# Patient Record
Sex: Male | Born: 1975
Health system: Southern US, Community
[De-identification: ages and names within clinical notes are randomized; demographics above are authoritative.]

## PROBLEM LIST (undated history)

## (undated) DIAGNOSIS — R413 Other amnesia: Secondary | ICD-10-CM

## (undated) DIAGNOSIS — S069X9A Unspecified intracranial injury with loss of consciousness of unspecified duration, initial encounter: Secondary | ICD-10-CM

## (undated) DIAGNOSIS — R569 Unspecified convulsions: Secondary | ICD-10-CM

## (undated) DIAGNOSIS — J45909 Unspecified asthma, uncomplicated: Secondary | ICD-10-CM

## (undated) HISTORY — PX: CRANIOTOMY: SHX93

## (undated) HISTORY — PX: BRAIN SURGERY: SHX531

## (undated) HISTORY — DX: Unspecified convulsions: R56.9

---

## 2004-07-14 DIAGNOSIS — S069X9A Unspecified intracranial injury with loss of consciousness of unspecified duration, initial encounter: Secondary | ICD-10-CM

## 2004-07-14 DIAGNOSIS — S069XAA Unspecified intracranial injury with loss of consciousness status unknown, initial encounter: Secondary | ICD-10-CM

## 2004-07-14 HISTORY — DX: Unspecified intracranial injury with loss of consciousness of unspecified duration, initial encounter: S06.9X9A

## 2004-07-14 HISTORY — PX: ENUCLEATION: SHX628

## 2004-07-14 HISTORY — PX: BRAIN SURGERY: SHX531

## 2004-07-14 HISTORY — DX: Unspecified intracranial injury with loss of consciousness status unknown, initial encounter: S06.9XAA

## 2005-11-01 HISTORY — PX: BRAIN SURGERY: SHX531

## 2008-12-14 ENCOUNTER — Emergency Department (HOSPITAL_COMMUNITY): Admission: EM | Admit: 2008-12-14 | Discharge: 2008-12-14 | Payer: Self-pay | Admitting: Emergency Medicine

## 2009-05-12 ENCOUNTER — Emergency Department (HOSPITAL_COMMUNITY): Admission: EM | Admit: 2009-05-12 | Discharge: 2009-05-13 | Payer: Self-pay | Admitting: Emergency Medicine

## 2010-01-05 ENCOUNTER — Encounter
Admission: RE | Admit: 2010-01-05 | Discharge: 2010-01-07 | Payer: Self-pay | Admitting: Physical Medicine & Rehabilitation

## 2010-01-07 ENCOUNTER — Ambulatory Visit: Payer: Self-pay | Admitting: Physical Medicine & Rehabilitation

## 2010-05-13 ENCOUNTER — Encounter
Admission: RE | Admit: 2010-05-13 | Discharge: 2010-05-22 | Payer: Self-pay | Admitting: Physical Medicine & Rehabilitation

## 2010-05-22 ENCOUNTER — Ambulatory Visit: Payer: Self-pay | Admitting: Physical Medicine & Rehabilitation

## 2010-08-26 ENCOUNTER — Encounter
Admission: RE | Admit: 2010-08-26 | Discharge: 2010-09-01 | Payer: Self-pay | Source: Home / Self Care | Attending: Physical Medicine & Rehabilitation | Admitting: Physical Medicine & Rehabilitation

## 2010-09-01 ENCOUNTER — Ambulatory Visit: Payer: Self-pay | Admitting: Physical Medicine & Rehabilitation

## 2010-11-19 ENCOUNTER — Encounter
Admission: RE | Admit: 2010-11-19 | Discharge: 2010-11-24 | Payer: Self-pay | Source: Home / Self Care | Attending: Physical Medicine & Rehabilitation | Admitting: Physical Medicine & Rehabilitation

## 2010-11-24 ENCOUNTER — Ambulatory Visit
Admission: RE | Admit: 2010-11-24 | Discharge: 2010-11-24 | Payer: Self-pay | Source: Home / Self Care | Attending: Physical Medicine & Rehabilitation | Admitting: Physical Medicine & Rehabilitation

## 2011-02-16 LAB — CBC
MCHC: 34.4 g/dL (ref 30.0–36.0)
MCV: 89.2 fL (ref 78.0–100.0)
RBC: 4.93 MIL/uL (ref 4.22–5.81)

## 2011-02-16 LAB — COMPREHENSIVE METABOLIC PANEL
AST: 42 U/L — ABNORMAL HIGH (ref 0–37)
CO2: 27 mEq/L (ref 19–32)
Calcium: 9.2 mg/dL (ref 8.4–10.5)
Creatinine, Ser: 1.13 mg/dL (ref 0.4–1.5)
GFR calc Af Amer: >60 mL/min (ref >60)
GFR calc non Af Amer: >60 mL/min (ref >60)

## 2011-02-16 LAB — URINALYSIS, ROUTINE W REFLEX MICROSCOPIC
Bilirubin Urine: NEGATIVE
Glucose, UA: NEGATIVE mg/dL
Ketones, ur: 15 mg/dL — AB
pH: 5.5 (ref 5.0–8.0)

## 2011-02-16 LAB — DIFFERENTIAL
Eosinophils Relative: 3 % (ref 0–5)
Lymphocytes Relative: 8 % — ABNORMAL LOW (ref 12–46)
Lymphs Abs: 0.7 10*3/uL (ref 0.7–4.0)
Neutro Abs: 7.3 10*3/uL (ref 1.7–7.7)
Neutrophils Relative %: 84 % — ABNORMAL HIGH (ref 43–77)

## 2011-02-16 LAB — RAPID URINE DRUG SCREEN, HOSP PERFORMED
Barbiturates: NOT DETECTED
Benzodiazepines: NOT DETECTED
Cocaine: NOT DETECTED

## 2011-02-16 LAB — URINE MICROSCOPIC-ADD ON

## 2011-03-16 ENCOUNTER — Encounter
Payer: Worker's Compensation | Attending: Physical Medicine & Rehabilitation | Admitting: Physical Medicine & Rehabilitation

## 2011-03-16 DIAGNOSIS — S069XAS Unspecified intracranial injury with loss of consciousness status unknown, sequela: Secondary | ICD-10-CM | POA: Insufficient documentation

## 2011-03-16 DIAGNOSIS — R569 Unspecified convulsions: Secondary | ICD-10-CM

## 2011-03-16 DIAGNOSIS — R4189 Other symptoms and signs involving cognitive functions and awareness: Secondary | ICD-10-CM | POA: Insufficient documentation

## 2011-03-16 DIAGNOSIS — Z9001 Acquired absence of eye: Secondary | ICD-10-CM | POA: Insufficient documentation

## 2011-03-16 DIAGNOSIS — S069X9S Unspecified intracranial injury with loss of consciousness of unspecified duration, sequela: Secondary | ICD-10-CM | POA: Insufficient documentation

## 2011-03-16 DIAGNOSIS — F17200 Nicotine dependence, unspecified, uncomplicated: Secondary | ICD-10-CM | POA: Insufficient documentation

## 2011-03-16 DIAGNOSIS — G40909 Epilepsy, unspecified, not intractable, without status epilepticus: Secondary | ICD-10-CM | POA: Insufficient documentation

## 2011-03-16 DIAGNOSIS — G4700 Insomnia, unspecified: Secondary | ICD-10-CM

## 2011-03-16 DIAGNOSIS — S069X9A Unspecified intracranial injury with loss of consciousness of unspecified duration, initial encounter: Secondary | ICD-10-CM

## 2011-03-16 DIAGNOSIS — X58XXXS Exposure to other specified factors, sequela: Secondary | ICD-10-CM | POA: Insufficient documentation

## 2011-03-16 DIAGNOSIS — G479 Sleep disorder, unspecified: Secondary | ICD-10-CM | POA: Insufficient documentation

## 2011-03-16 DIAGNOSIS — F070 Personality change due to known physiological condition: Secondary | ICD-10-CM

## 2011-03-16 NOTE — Assessment & Plan Note (Signed)
Mark Chambers is back regarding his traumatic brain injury.  He is doing better with his Keppra.  He has had no further seizures.  He still has problems of sleep and memory.  He is sleeping better for the most part, but still often plays video games late at night and has a TV on.  He tells me that he did have a driving assessment done while he was in [place].  He did not bring that report with him today and frankly does not know where it is.  Mother is with him today and notes that he has had diarrhea and frequent bowel movements.  Some of his siblings also suffered from this as well as his father.  REVIEW OF SYSTEMS:  Notable for the above.  Full 12-point review is in the written health and history section of the chart.  SOCIAL HISTORY:  The patient single, is still living with his brother. Smoking a pack of cigarettes per week.  PHYSICAL EXAMINATION:  VITAL SIGNS:  Blood pressure is 133/69, pulse 77, respiratory rate 18, he is satting 98% on room air. GENERAL:  The patient is pleasant, alert.  Affect is bright and appropriate.  Insight and awareness is good.  He still has some problems with memory and attention.  His focus is reasonable still. MUSCULOSKELETAL:  Motor sensory function are intact for movements and gait.  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  ASSESSMENT: 1. 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 2. Ongoing cognitive deficits related to the above. 3. Seizure disorder.  PLAN: 1. We will need to acquire his driving assessment evaluation in order     to assess the need for further followup there. 2. We will decrease his Exelon to 3 mg daily to see if this helps some     of his diarrhea and bowel symptoms.  Recommended trying a probiotic     daily also. 3. Mark Chambers needs to be responsive regarding his sleep schedule and needs to     establish better habits overall.  He is not creating environment to     sleep, he will not sleep ultimately. 4.  Refilled his Focalin XR 20 mg daily #30. 5. I will see him back here in about 2 months.  He is to call me with     any problems or questions.     Ranelle Oyster, M.D. Electronically Signed    ZTS/MedQ D:  03/16/2011 12:01:02  T:  03/16/2011 23:41:10  Job #:  [id number]

## 2011-05-12 ENCOUNTER — Encounter
Payer: Worker's Compensation | Attending: Physical Medicine & Rehabilitation | Admitting: Physical Medicine & Rehabilitation

## 2011-05-12 DIAGNOSIS — S0570XA Avulsion of unspecified eye, initial encounter: Secondary | ICD-10-CM | POA: Insufficient documentation

## 2011-05-12 DIAGNOSIS — S069X9A Unspecified intracranial injury with loss of consciousness of unspecified duration, initial encounter: Secondary | ICD-10-CM | POA: Insufficient documentation

## 2011-05-12 DIAGNOSIS — X58XXXA Exposure to other specified factors, initial encounter: Secondary | ICD-10-CM | POA: Insufficient documentation

## 2011-05-12 DIAGNOSIS — F09 Unspecified mental disorder due to known physiological condition: Secondary | ICD-10-CM | POA: Insufficient documentation

## 2011-05-12 DIAGNOSIS — G40909 Epilepsy, unspecified, not intractable, without status epilepticus: Secondary | ICD-10-CM | POA: Insufficient documentation

## 2011-05-12 DIAGNOSIS — R569 Unspecified convulsions: Secondary | ICD-10-CM

## 2011-05-12 DIAGNOSIS — F070 Personality change due to known physiological condition: Secondary | ICD-10-CM

## 2011-05-12 DIAGNOSIS — R41840 Attention and concentration deficit: Secondary | ICD-10-CM | POA: Insufficient documentation

## 2011-05-12 DIAGNOSIS — S069XAA Unspecified intracranial injury with loss of consciousness status unknown, initial encounter: Secondary | ICD-10-CM | POA: Insufficient documentation

## 2011-05-12 NOTE — Assessment & Plan Note (Signed)
Mark Chambers is back regarding his traumatic brain injury.  We bumped the Exelon down to 3 mg daily and his GI symptoms are better.  He has not required his driving testing records yet, but they told him they would send him to [place].  He is working on better scheduling and sleep patterns as a whole.  The patient is anxious to drive.  REVIEW OF SYSTEMS:  The patient reports loss of taste, smell, bowel control problems as noted previously, but improved.  Full 12-point review is in the written health and history section of the chart.  SOCIAL HISTORY:  Unchanged.  PHYSICAL EXAMINATION:  VITAL SIGNS:  Blood pressure 115/72, pulse 86, respiratory rate 18, he is satting 99% on room air. GENERAL:  The patient is pleasant and alert.  His attention seems better.  He does have some problems with short-term memory and focus though at times.  Balance is good.  He has normal sensory function.  He has some impairment of his visual fields due to his prosthetic left eye.  ASSESSMENT: 1. Traumatic brain injury, his left eye, enucleation. 2. 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 3. History of seizure disorder.  PLAN: 1. Await copies of recent driving testing.  Would need repeat testing     to pursue further driving. 2. Due to his positive response to the Exelon decrease, we will go     ahead and stop the medication.  He does complain of feeling jittery     and restless at times, particularly when he is focusing on activity     and I wonder whether this is a side effect of medication versus the     brain injury itself.  If the Exelon decrease helps this then we can     stay where we are at.  If he does not see a big change in this, we     could consider increasing his Focalin to 30 mg.  I asked him to     call me regardless about 2 weeks to tell me where he is at and then     we can move forward from there.  I did not refill his Focalin as     this was done recently.  Mark Chambers was present for this  exam and     discussion.     Ranelle Oyster, M.D. Electronically Signed    ZTS/MedQ D:  05/12/2011 14:59:33  T:  05/12/2011 21:22:00  Job #:  [id number]

## 2011-07-07 ENCOUNTER — Encounter
Payer: Worker's Compensation | Attending: Physical Medicine & Rehabilitation | Admitting: Physical Medicine & Rehabilitation

## 2011-07-07 DIAGNOSIS — F09 Unspecified mental disorder due to known physiological condition: Secondary | ICD-10-CM | POA: Insufficient documentation

## 2011-07-07 DIAGNOSIS — R41840 Attention and concentration deficit: Secondary | ICD-10-CM | POA: Insufficient documentation

## 2011-07-07 DIAGNOSIS — S069XAA Unspecified intracranial injury with loss of consciousness status unknown, initial encounter: Secondary | ICD-10-CM | POA: Insufficient documentation

## 2011-07-07 DIAGNOSIS — R569 Unspecified convulsions: Secondary | ICD-10-CM

## 2011-07-07 DIAGNOSIS — X58XXXA Exposure to other specified factors, initial encounter: Secondary | ICD-10-CM | POA: Insufficient documentation

## 2011-07-07 DIAGNOSIS — S069X9A Unspecified intracranial injury with loss of consciousness of unspecified duration, initial encounter: Secondary | ICD-10-CM | POA: Insufficient documentation

## 2011-07-07 DIAGNOSIS — S0570XA Avulsion of unspecified eye, initial encounter: Secondary | ICD-10-CM | POA: Insufficient documentation

## 2011-07-07 DIAGNOSIS — G40909 Epilepsy, unspecified, not intractable, without status epilepticus: Secondary | ICD-10-CM | POA: Insufficient documentation

## 2011-07-07 DIAGNOSIS — F070 Personality change due to known physiological condition: Secondary | ICD-10-CM

## 2011-07-07 NOTE — Assessment & Plan Note (Signed)
The patient is back regarding his traumatic brain injury.  We weaned the Exelon off and he is doing quite well.  He actually ran out of his Focalin on Friday and he was not able to come back for a pick up.  He states he had noticed lot of change.  Mother feels that he has had a drop off over last several months with his attention and focus and becomes very distracted.  He states his sleeping is better.  He does complain of being a bit depressed at times regarding his brain injury as well as his persistent deficits.  REVIEW OF SYSTEMS:  Notable for the above.  Full 12-point review is in the written health and history section of the chart.  SOCIAL HISTORY:  Unchanged, he is living with his younger brother.  He is smoking a pack cigarettes per week.  PHYSICAL EXAMINATION:  VITAL SIGNS:  Blood pressure is 105/67, pulse 66, respiratory rate 14 and he is satting 98% on room air. GENERAL:  The patient is pleasant and alert. NEUROLOGIC:  He continues to have some memory and focus problems, although I did not see it beyond baseline.  He is a bit flat, but pleasant.  He does not appear overtly depressed.  Gait is generally stable.  He continues to have the prosthetic left eye.  Vision is not changed. HEART:  Regular. CHEST:  Clear. ABDOMEN:  Soft and nontender.  ASSESSMENT: 1. 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 2. Cognitive attention deficits related to the above. 3. Seizure disorder.  PLAN: 1. We will hold off on the Focalin at this point and see how he does.     I suggested he though increasing the Focalin and observing for     further side effects or trying something like Strattera. 2. I did order multiple tests to assess for any other causes of     decreased attention, awareness and overall energy levels.  We will     check TSH, testosterone level, cortisol, CBC, CMET and B12 levels. 3. I will see the patient back and scheduled in about 3 months.  I     will call him  regarding the results and asked the patient to call     me if he has any further needs and wants to start one of the     medications mentioned above.     Ranelle Oyster, M.D. Electronically Signed    ZTS/MedQ D:  07/07/2011 11:59:12  T:  07/07/2011 12:50:38  Job #:  [id number]

## 2011-10-06 ENCOUNTER — Encounter: Payer: Worker's Compensation | Admitting: Physical Medicine & Rehabilitation

## 2011-10-06 ENCOUNTER — Encounter
Payer: Worker's Compensation | Attending: Physical Medicine & Rehabilitation | Admitting: Physical Medicine & Rehabilitation

## 2011-10-06 DIAGNOSIS — F070 Personality change due to known physiological condition: Secondary | ICD-10-CM

## 2011-10-06 DIAGNOSIS — G40909 Epilepsy, unspecified, not intractable, without status epilepticus: Secondary | ICD-10-CM | POA: Insufficient documentation

## 2011-10-06 DIAGNOSIS — S0570XA Avulsion of unspecified eye, initial encounter: Secondary | ICD-10-CM | POA: Insufficient documentation

## 2011-10-06 DIAGNOSIS — S069X9A Unspecified intracranial injury with loss of consciousness of unspecified duration, initial encounter: Secondary | ICD-10-CM | POA: Insufficient documentation

## 2011-10-06 DIAGNOSIS — X58XXXA Exposure to other specified factors, initial encounter: Secondary | ICD-10-CM | POA: Insufficient documentation

## 2011-10-06 DIAGNOSIS — F09 Unspecified mental disorder due to known physiological condition: Secondary | ICD-10-CM | POA: Insufficient documentation

## 2011-10-06 DIAGNOSIS — S069XAA Unspecified intracranial injury with loss of consciousness status unknown, initial encounter: Secondary | ICD-10-CM | POA: Insufficient documentation

## 2011-10-06 DIAGNOSIS — R41840 Attention and concentration deficit: Secondary | ICD-10-CM | POA: Insufficient documentation

## 2011-10-06 DIAGNOSIS — R569 Unspecified convulsions: Secondary | ICD-10-CM

## 2011-10-07 NOTE — Assessment & Plan Note (Signed)
HISTORY:  [name] is back regarding his brain injury.  We weaned off the Focalin at the last visit.  He really had no problems.  He had one day "where he felt out of it"  but it was related to some other stressors around in the house, it seems.  His mood and things are going well in his new location.  He checked lab work at last visit and was all within normal limits and our office was notified to that effect.  He does still have vision issues on the left side, but for the most part, these have been improved.  He will go back to see Ophthalmology at [facility] for further recommendations regarding his eyewear and prescription. Sleep is fair.  REVIEW OF SYSTEMS:  Notable for the above.  Full 12-point review is in the written health history section in the chart.  SOCIAL HISTORY:  Unchanged.  He is living with his younger brother.  He does not smoke anymore as he has quit and he is quite proud of that.  PHYSICAL EXAMINATION:  VITAL SIGNS:  Blood pressure is 108/70, pulse 79, respiratory rate 16, he is satting 94% on room air. GENERAL:  The patient is pleasant and alert. NEUROLOGIC:  Vision is unchanged in my opinion grossly.  Gait stable. His memory and attention are fair and he is at his baseline.  Affect is slightly flat, but he is generally appropriate, otherwise.  ASSESSMENT: 1. Traumatic brain injury, left eye enucleation. 2. 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 3. Seizure disorder.  PLAN: 1. I would now recommend any further stimulant at this point as I     think he is doing quite well off them and really we have seen no     change. 2. Continue with the Keppra and trazodone for sleep and seizure     prophylaxis.  He has optimized sleep and better health hygiene as a     whole, which I think he is trying to do. 3. I will follow up at [facility]. 4. I will see him back in 6 months.     Ranelle Oyster, M.D. Electronically Signed    ZTS/MedQ D:   10/06/2011 14:43:31  T:  10/06/2011 17:55:48  Job #:  [id number]

## 2012-02-16 ENCOUNTER — Other Ambulatory Visit: Payer: Self-pay | Admitting: *Deleted

## 2012-02-16 MED ORDER — LEVETIRACETAM ER 500 MG PO TB24: 1000.0000 mg | ORAL_TABLET | Freq: Every day | ORAL | Status: DC

## 2012-03-01 ENCOUNTER — Other Ambulatory Visit: Payer: Self-pay | Admitting: *Deleted

## 2012-03-01 MED ORDER — TRAZODONE HCL 150 MG PO TABS: 150.0000 mg | ORAL_TABLET | Freq: Every day | ORAL | Status: DC

## 2012-04-05 ENCOUNTER — Encounter: Payer: Self-pay | Admitting: Physical Medicine & Rehabilitation

## 2012-04-05 ENCOUNTER — Encounter
Payer: Worker's Compensation | Attending: Physical Medicine & Rehabilitation | Admitting: Physical Medicine & Rehabilitation

## 2012-04-05 VITALS — BP 130/78 | HR 74 | Resp 16 | Ht 76.0 in | Wt 191.0 lb

## 2012-04-05 DIAGNOSIS — G40909 Epilepsy, unspecified, not intractable, without status epilepticus: Secondary | ICD-10-CM

## 2012-04-05 DIAGNOSIS — X58XXXA Exposure to other specified factors, initial encounter: Secondary | ICD-10-CM | POA: Insufficient documentation

## 2012-04-05 DIAGNOSIS — S069XAA Unspecified intracranial injury with loss of consciousness status unknown, initial encounter: Secondary | ICD-10-CM | POA: Insufficient documentation

## 2012-04-05 DIAGNOSIS — S069X9A Unspecified intracranial injury with loss of consciousness of unspecified duration, initial encounter: Secondary | ICD-10-CM

## 2012-04-05 DIAGNOSIS — S0570XA Avulsion of unspecified eye, initial encounter: Secondary | ICD-10-CM | POA: Insufficient documentation

## 2012-04-05 NOTE — Patient Instructions (Signed)
YOU NEED TO SET SOME GOALS FOR THE LONG TERM!!!!!!!!!!

## 2012-04-05 NOTE — Progress Notes (Signed)
Subjective:    Patient ID: Mark Chambers, male    DOB: [birth year], 36 y.o.   MRN: [id number]  HPI Mark Chambers is back regarding his TBI. His mother is with him today. He apparently he is sleeping more. It sounds as if he's hanging around the house a lot. He's been keeping up with his medications for the most part. He is only taking the keppra-24 tabs once per day (1000mg ). Al denies further seizures.  He has come off stimulants. He feels that he has reasonable energy, but his motivation is lacking. He tries to exercise daily. Typically, he will walk a mile or two daily. He plays chess on a regular basis.   He has looked at enrolling at [facility], but hasn't followed through with that yet. He admittedly doesn't have a lot of goals for the long term.    Pain Inventory Average Pain 3 Pain Right Now 1 My pain is intermittent  In the last 24 hours, has pain interfered with the following? General activity 2 Relation with others 0 Enjoyment of life 4 What TIME of day is your pain at its worst? night Sleep (in general) Fair  Pain is worse with: inactivity Pain improves with: rest Relief from Meds: N/A  Mobility walk without assistance how many minutes can you walk? 90 ability to climb steps?  yes do you drive?  yes  Function not employed: date last employed 07/14/2004 disabled: date disabled 07/14/2004 I need assistance with the following:  meal prep and household duties  Neuro/Psych confusion  Prior Studies Any changes since last visit?  no  Physicians involved in your care Any changes since last visit?  no   History reviewed. No pertinent family history. History   Social History  . Marital Status: Single    Spouse Name: N/A    Number of Children: N/A  . Years of Education: N/A   Social History Main Topics  . Smoking status: Current Some Day Smoker  . Smokeless tobacco: None  . Alcohol Use: None  . Drug Use: None  . Sexually Active: None   Other Topics Concern  .  None   Social History Narrative  . None   Past Surgical History  Procedure Date  . Brain surgery   . Craniotomy    Past Medical History  Diagnosis Date  . Seizures    BP 130/78  Pulse 74  Resp 16  Ht 6\' 4"  (1.93 m)  Wt 191 lb (86.637 kg)  BMI 23.25 kg/m2  SpO2 98%      Review of Systems  Constitutional: Negative.   HENT: Negative.   Eyes: Negative.   Respiratory: Negative.   Cardiovascular: Negative.   Gastrointestinal: Negative.   Genitourinary: Negative.   Musculoskeletal: Negative.   Skin: Negative.   Neurological: Negative.   Hematological: Negative.   Psychiatric/Behavioral: Positive for confusion.       Objective:   Physical Exam  Constitutional: He is oriented to person, place, and time. He appears well-developed and well-nourished.  HENT:  Head: Normocephalic and atraumatic.  Right Ear: External ear normal.  Left Ear: External ear normal.  Mouth/Throat: Oropharynx is clear and moist.  Eyes: Conjunctivae and EOM are normal. Pupils are equal, round, and reactive to light.  Neck: Normal range of motion. Neck supple.  Cardiovascular: Normal rate and regular rhythm.   Pulmonary/Chest: Effort normal and breath sounds normal. No respiratory distress. He has no wheezes.  Abdominal: Soft.  Neurological: He is alert and oriented to person, place, and  time.       Left eye enucleated. Short term memory deficits. Conversationally appropriate. Fair insight and awareness.  Skin: Skin is warm.  Psychiatric: He has a normal mood and affect. His behavior is normal. Judgment and thought content normal.          Assessment & Plan:  ASSESSMENT:  1. Traumatic brain injury, left eye enucleation.  2. 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  3. Seizure disorder.   PLAN:  1. Discussed goal related activities. At this point, he needs to find some "bigger picture" aspirations which can help drive him forward. He needs to start setting goals to start  with. 2. Continue with the Keppra and trazodone for sleep and seizure  prophylaxis.  3. I will see him back in 6 months.

## 2012-05-15 ENCOUNTER — Other Ambulatory Visit: Payer: Self-pay | Admitting: Physical Medicine & Rehabilitation

## 2012-08-07 ENCOUNTER — Other Ambulatory Visit: Payer: Self-pay | Admitting: *Deleted

## 2012-08-07 MED ORDER — LEVETIRACETAM ER 500 MG PO TB24: 1000.0000 mg | ORAL_TABLET | Freq: Every day | ORAL | Status: DC

## 2012-09-11 ENCOUNTER — Other Ambulatory Visit: Payer: Self-pay | Admitting: Physical Medicine & Rehabilitation

## 2012-10-04 ENCOUNTER — Encounter
Payer: Worker's Compensation | Attending: Physical Medicine & Rehabilitation | Admitting: Physical Medicine & Rehabilitation

## 2012-10-04 ENCOUNTER — Encounter: Payer: Self-pay | Admitting: Physical Medicine & Rehabilitation

## 2012-10-04 VITALS — BP 128/78 | HR 78 | Resp 14 | Ht 76.0 in | Wt 197.8 lb

## 2012-10-04 DIAGNOSIS — S069X9A Unspecified intracranial injury with loss of consciousness of unspecified duration, initial encounter: Secondary | ICD-10-CM

## 2012-10-04 DIAGNOSIS — X58XXXA Exposure to other specified factors, initial encounter: Secondary | ICD-10-CM | POA: Insufficient documentation

## 2012-10-04 DIAGNOSIS — G40909 Epilepsy, unspecified, not intractable, without status epilepticus: Secondary | ICD-10-CM

## 2012-10-04 DIAGNOSIS — R569 Unspecified convulsions: Secondary | ICD-10-CM | POA: Insufficient documentation

## 2012-10-04 DIAGNOSIS — S069X0A Unspecified intracranial injury without loss of consciousness, initial encounter: Secondary | ICD-10-CM | POA: Insufficient documentation

## 2012-10-04 DIAGNOSIS — R41840 Attention and concentration deficit: Secondary | ICD-10-CM | POA: Insufficient documentation

## 2012-10-04 NOTE — Patient Instructions (Signed)
CONTINUE TAKING YOUR TRAZODONE AT NIGHT. YOU MIGHT NEED TO TAKE AN EXTRA DOSE IF YOU AWAKEN BEFORE 3AM  YOU MAY ALSO TRY OVER THE COUNTER MELATONIN AT NIGHT TO ASSIST YOUR SLEEP. TAKE ONE TO TWO TABLETS (3-6MG ) WITH YOUR TRAZODONE. START WITH ONE TABLET FIRST

## 2012-10-04 NOTE — Progress Notes (Signed)
Subjective:    Patient ID: Mark Chambers, male    DOB: 11-12-1975, 36 y.o.   MRN: [id number]  HPI  Mark Chambers is back regarding his TBI. For the most part he has been resting better. He sometimes wakes up and has a difficult time going back to sleep afterwards. He is using the trazodone 4-5 days per week.   Otherwise things have not changed at home for the most part. He hasn't been involved in any new activities or ventures outside the home since the summer.   He would like to begin driving. He has not found "a driving school."    Pain Inventory Average Pain 3 Pain Right Now 2 My pain is dull  In the last 24 hours, has pain interfered with the following? General activity 0 Relation with others 3 Enjoyment of life 3 What TIME of day is your pain at its worst? night Sleep (in general) Fair  Pain is worse with: inactivity Pain improves with: rest Relief from Meds: 8  Mobility walk without assistance how many minutes can you walk? 90 ability to climb steps?  yes do you drive?  no  Function disabled: date disabled 2005 I need assistance with the following:  meal prep and household duties  Neuro/Psych bowel control problems  Prior Studies Any changes since last visit?  no  Physicians involved in your care Any changes since last visit?  no   History reviewed. No pertinent family history. History   Social History  . Marital Status: Single    Spouse Name: N/A    Number of Children: N/A  . Years of Education: N/A   Social History Main Topics  . Smoking status: Former Games developer  . Smokeless tobacco: Never Used  . Alcohol Use: None  . Drug Use: None  . Sexually Active: None   Other Topics Concern  . None   Social History Narrative  . None   Past Surgical History  Procedure Date  . Brain surgery   . Craniotomy    Past Medical History  Diagnosis Date  . Seizures    BP 128/78  Pulse 78  Resp 14  Ht 6\' 4"  (1.93 m)  Wt 197 lb 12.8 oz (89.721 kg)  BMI  24.08 kg/m2  SpO2 98%   Review of Systems  All other systems reviewed and are negative.       Objective:   Physical Exam Constitutional: He is oriented to person, place, and time. He appears well-developed and well-nourished.  HENT:  Head: Normocephalic and atraumatic.  Right Ear: External ear normal.  Left Ear: External ear normal.  Mouth/Throat: Oropharynx is clear and moist.  Eyes: Conjunctivae and EOM are normal. Pupils are equal, round, and reactive to light.  Neck: Normal range of motion. Neck supple.  Cardiovascular: Normal rate and regular rhythm.  Pulmonary/Chest: Effort normal and breath sounds normal. No respiratory distress. He has no wheezes.  Abdominal: Soft.  Neurological: He is alert and oriented to person, place, and time.  Left eye enucleated. Short term memory deficits. Conversationally appropriate. Fair insight and awareness. Wearing new glasses today. Balance is good. Continues to have STM deficits and problems with attention Skin: Skin is warm.  Psychiatric: He has a normal mood and affect. His behavior is normal. Judgment and thought content normal.    Assessment & Plan:   ASSESSMENT:  1. Traumatic brain injury, left eye enucleation.  2. 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  3. Seizure disorder.   PLAN:  1. Discussed goal related activities which are outside of "his groove.". A  2. Continue with the Keppra and trazodone for sleep and seizure  prophylaxis. I think he needs to continue the trazodone on a scheduled basis for now. We also discussed the use of melatonin to improve his sleep hygiene.  3. Made a referral to OT for formal driving evaluation and potentially treatment 4. I will see him back in 6 months.

## 2012-12-04 DIAGNOSIS — H31019 Macula scars of posterior pole (postinflammatory) (post-traumatic), unspecified eye: Secondary | ICD-10-CM | POA: Insufficient documentation

## 2012-12-04 DIAGNOSIS — H5210 Myopia, unspecified eye: Secondary | ICD-10-CM | POA: Insufficient documentation

## 2012-12-04 DIAGNOSIS — Q111 Other anophthalmos: Secondary | ICD-10-CM | POA: Insufficient documentation

## 2012-12-04 DIAGNOSIS — H53419 Scotoma involving central area, unspecified eye: Secondary | ICD-10-CM | POA: Insufficient documentation

## 2012-12-27 ENCOUNTER — Other Ambulatory Visit: Payer: Self-pay | Admitting: Physical Medicine & Rehabilitation

## 2013-01-08 ENCOUNTER — Telehealth: Payer: Self-pay | Admitting: Physical Medicine & Rehabilitation

## 2013-01-08 NOTE — Telephone Encounter (Signed)
Radene Ou from [employer] insurance called and they need a letter from Dr. Riley Kill stating that this patient has been seizure free for 6 months.  Please fax the letter to Radene Ou [phone] and put reference claim # on cover sheet.  Michael's phone [phone]

## 2013-01-09 NOTE — Telephone Encounter (Signed)
Why is this needed? i am not going to write such a letter without making sure it's for his best interest

## 2013-01-09 NOTE — Telephone Encounter (Signed)
Spoke with Casimiro Needle and he needs to know when the patient had his last seizure in order to get driving evaluation.  According to Casimiro Needle patient is not seeing any other providers.  Please advise.

## 2013-01-09 NOTE — Telephone Encounter (Signed)
It's been way over 6 months but i'm not sure of the exact date.

## 2013-01-10 NOTE — Telephone Encounter (Signed)
Left message advising case manager that the patient has not had a seizure in over 6 months but last date is unknown.

## 2013-02-06 ENCOUNTER — Other Ambulatory Visit: Payer: Self-pay | Admitting: Physical Medicine & Rehabilitation

## 2013-02-11 ENCOUNTER — Other Ambulatory Visit: Payer: Self-pay | Admitting: Physical Medicine & Rehabilitation

## 2013-04-03 ENCOUNTER — Encounter: Payer: Self-pay | Admitting: Physical Medicine & Rehabilitation

## 2013-04-03 ENCOUNTER — Encounter
Payer: Worker's Compensation | Attending: Physical Medicine & Rehabilitation | Admitting: Physical Medicine & Rehabilitation

## 2013-04-03 VITALS — BP 115/68 | HR 67 | Resp 14 | Ht 76.0 in | Wt 197.0 lb

## 2013-04-03 DIAGNOSIS — S069X9A Unspecified intracranial injury with loss of consciousness of unspecified duration, initial encounter: Secondary | ICD-10-CM | POA: Insufficient documentation

## 2013-04-03 DIAGNOSIS — G40909 Epilepsy, unspecified, not intractable, without status epilepticus: Secondary | ICD-10-CM | POA: Insufficient documentation

## 2013-04-03 DIAGNOSIS — S0570XA Avulsion of unspecified eye, initial encounter: Secondary | ICD-10-CM | POA: Insufficient documentation

## 2013-04-03 DIAGNOSIS — Z79899 Other long term (current) drug therapy: Secondary | ICD-10-CM | POA: Insufficient documentation

## 2013-04-03 DIAGNOSIS — S069XAA Unspecified intracranial injury with loss of consciousness status unknown, initial encounter: Secondary | ICD-10-CM | POA: Insufficient documentation

## 2013-04-03 DIAGNOSIS — Z5189 Encounter for other specified aftercare: Secondary | ICD-10-CM

## 2013-04-03 DIAGNOSIS — R41840 Attention and concentration deficit: Secondary | ICD-10-CM | POA: Insufficient documentation

## 2013-04-03 DIAGNOSIS — S069X0D Unspecified intracranial injury without loss of consciousness, subsequent encounter: Secondary | ICD-10-CM

## 2013-04-03 DIAGNOSIS — G4700 Insomnia, unspecified: Secondary | ICD-10-CM | POA: Insufficient documentation

## 2013-04-03 DIAGNOSIS — X58XXXA Exposure to other specified factors, initial encounter: Secondary | ICD-10-CM | POA: Insufficient documentation

## 2013-04-03 MED ORDER — TRAZODONE HCL 150 MG PO TABS: 75.0000 mg | ORAL_TABLET | Freq: Every day | ORAL | Status: DC

## 2013-04-03 NOTE — Progress Notes (Signed)
Subjective:    Patient ID: Mark Chambers, male    DOB: 03-27-1976, 37 y.o.   MRN: [id number]  HPI  Al is back regarding his TBI. Things have been fairly stable. He did bite his cheek pretty hard the other day, and his cheek has been pretty sore since then.   He is taking his medications as rx'ed. His sleep has been better. He is also taking 5 mg of melatonin which has helped.   He completed his driving assessment. He completed the recommended 15 hour observation afterwards. He was approved for day time, local driving. He is driving a scooter for local driving.   He has been active and out of the house pretty frequently. He is lifting weights too with his dumb bells at home.     Pain Inventory Average Pain 7 Pain Right Now 7 My pain is burning  In the last 24 hours, has pain interfered with the following? General activity 8 Relation with others 6 Enjoyment of life 6 What TIME of day is your pain at its worst? daytime Sleep (in general) Poor  Pain is worse with: unsure Pain improves with: pacing activities Relief from Meds: n/a  Mobility walk without assistance how many minutes can you walk? 60 ability to climb steps?  yes do you drive?  no Do you have any goals in this area?  yes  Function not employed: date last employed 07/14/2004 Do you have any goals in this area?  yes  Neuro/Psych confusion depression suicidal thoughts-no plan  Prior Studies Any changes since last visit?  no  Physicians involved in your care Any changes since last visit?  no   History reviewed. No pertinent family history. History   Social History  . Marital Status: Single    Spouse Name: N/A    Number of Children: N/A  . Years of Education: N/A   Social History Main Topics  . Smoking status: Former Games developer  . Smokeless tobacco: Never Used  . Alcohol Use: None  . Drug Use: None  . Sexually Active: None   Other Topics Concern  . None   Social History Narrative  . None    Past Surgical History  Procedure Laterality Date  . Brain surgery    . Craniotomy     Past Medical History  Diagnosis Date  . Seizures    BP 115/68  Pulse 67  Resp 14  Ht 6\' 4"  (1.93 m)  Wt 197 lb (89.359 kg)  BMI 23.99 kg/m2  SpO2 100%     Review of Systems  Musculoskeletal: Positive for myalgias and arthralgias.  Psychiatric/Behavioral: Positive for dysphoric mood.  All other systems reviewed and are negative.       Objective:   Physical Exam Constitutional: He is oriented to person, place, and time. He appears well-developed and well-nourished.  HENT:  Head: Normocephalic and atraumatic.  Right Ear: External ear normal.  Left Ear: External ear normal.  Mouth/Throat: Oropharynx is clear and moist.  Eyes: Conjunctivae and EOM are normal. Pupils are equal, round, and reactive to light.  Neck: Normal range of motion. Neck supple.  Cardiovascular: Normal rate and regular rhythm.  Pulmonary/Chest: Effort normal and breath sounds normal. No respiratory distress. He has no wheezes.  Abdominal: Soft.  Neurological: He is alert and oriented to person, place, and time.  Left eye enucleated.   Conversationally appropriate. Fair insight and awareness. Occasionally with attention issues but cognition is functional. Balance is good. Strength is 5/5. Skin: Skin is  warm.  Psychiatric: He has a normal mood and affect. His behavior is normal. Judgment and thought content normal.    Assessment & Plan:   ASSESSMENT:  1. Traumatic brain injury, left eye enucleation.  2. 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  3. Seizure disorder.  4. Insomnia.  PLAN:  1. We discussed safety with driving, particularly in a scooter. I really would prefer him driving a car.  2. Continue with the Keppra for seizure  prophylaxis. Continue with the melatonin for sleep. I think he could try to wean the trazodone and see how things go. 3. All questions were encouraged and answered.   4. I will see him back in 6 months.

## 2013-04-03 NOTE — Patient Instructions (Signed)
CALL ME WITH ANY PROBLEMS OR QUESTIONS (#[phone]).  HAVE A GOOD DAY  

## 2013-05-02 ENCOUNTER — Other Ambulatory Visit: Payer: Self-pay | Admitting: Physical Medicine & Rehabilitation

## 2013-05-11 ENCOUNTER — Other Ambulatory Visit: Payer: Self-pay | Admitting: Physical Medicine & Rehabilitation

## 2013-05-12 ENCOUNTER — Other Ambulatory Visit: Payer: Self-pay | Admitting: Physical Medicine & Rehabilitation

## 2013-05-15 ENCOUNTER — Other Ambulatory Visit: Payer: Self-pay | Admitting: *Deleted

## 2013-05-15 MED ORDER — TRAZODONE HCL 150 MG PO TABS: ORAL_TABLET | ORAL | Status: DC

## 2013-05-15 NOTE — Telephone Encounter (Signed)
Pharmacy sent over 2 elcetronic requests for trazodone 100mg  and 150mg .  His last visit in June with Dr Carmon Ginsberg his directions was trazedone 150mg  1/2-1 tablet q hs with instructions to try and wean himself down.  Refilled trazodone per those instructions.

## 2013-08-06 ENCOUNTER — Other Ambulatory Visit: Payer: Self-pay | Admitting: Physical Medicine & Rehabilitation

## 2013-10-03 ENCOUNTER — Encounter
Payer: Worker's Compensation | Attending: Physical Medicine & Rehabilitation | Admitting: Physical Medicine & Rehabilitation

## 2013-10-03 ENCOUNTER — Encounter: Payer: Self-pay | Admitting: Physical Medicine & Rehabilitation

## 2013-10-03 VITALS — BP 101/67 | HR 72 | Resp 14 | Ht 76.0 in | Wt 203.0 lb

## 2013-10-03 DIAGNOSIS — G40909 Epilepsy, unspecified, not intractable, without status epilepticus: Secondary | ICD-10-CM

## 2013-10-03 DIAGNOSIS — S069X9S Unspecified intracranial injury with loss of consciousness of unspecified duration, sequela: Secondary | ICD-10-CM | POA: Insufficient documentation

## 2013-10-03 DIAGNOSIS — S069X0S Unspecified intracranial injury without loss of consciousness, sequela: Secondary | ICD-10-CM

## 2013-10-03 DIAGNOSIS — S069XAS Unspecified intracranial injury with loss of consciousness status unknown, sequela: Secondary | ICD-10-CM

## 2013-10-03 DIAGNOSIS — Z9001 Acquired absence of eye: Secondary | ICD-10-CM | POA: Insufficient documentation

## 2013-10-03 DIAGNOSIS — G4700 Insomnia, unspecified: Secondary | ICD-10-CM

## 2013-10-03 DIAGNOSIS — R4189 Other symptoms and signs involving cognitive functions and awareness: Secondary | ICD-10-CM | POA: Insufficient documentation

## 2013-10-03 DIAGNOSIS — X58XXXS Exposure to other specified factors, sequela: Secondary | ICD-10-CM | POA: Insufficient documentation

## 2013-10-03 MED ORDER — LEVETIRACETAM ER 500 MG PO TB24: ORAL_TABLET | ORAL | Status: DC

## 2013-10-03 NOTE — Patient Instructions (Signed)
FOR 2015----I WANT TO SEE YOU WORK TOWARD EXERCISE AND VOCATIONAL GOALS.   I WILL MAKE A VOCATIONAL REHAB REFERRAL.

## 2013-10-03 NOTE — Progress Notes (Signed)
Subjective:    Patient ID: Mark Chambers, male    DOB: 08-Apr-1976, 37 y.o.   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 He just came back from [place] spending the weekend with his family. He has been sleeping better as a whole. There have been no seizures.  He is participating in some leisure activities. He sometimes lifts weights. He still is riding his scooter. He says he needs a registration now for 2015.   He is now going to the [facility] support group.   He has a appt for a prosthetic eye appointment at the [place] next month.   Pain Inventory Average Pain 3 Pain Right Now 3 My pain is n/a  In the last 24 hours, has pain interfered with the following? General activity 0 Relation with others 5 Enjoyment of life 0 What TIME of day is your pain at its worst? daytime Sleep (in general) Fair  Pain is worse with: unsure Pain improves with: rest Relief from Meds: 5  Mobility walk without assistance how many minutes can you walk? 30 ability to climb steps?  yes do you drive?  yes  Function disabled: date disabled 07/2004 I need assistance with the following:  meal prep and household duties  Neuro/Psych bowel control problems  Prior Studies Any changes since last visit?  no  Physicians involved in your care Dr Ortencia Kick, Dr Clydene Pugh   History reviewed. No pertinent family history. History   Social History  . Marital Status: Single    Spouse Name: N/A    Number of Children: N/A  . Years of Education: N/A   Social History Main Topics  . Smoking status: Former Games developer  . Smokeless tobacco: Never Used  . Alcohol Use: None  . Drug Use: None  . Sexual Activity: None   Other Topics Concern  . None   Social History Narrative  . None   Past Surgical History  Procedure Laterality Date  . Brain surgery    . Craniotomy     Past Medical History  Diagnosis Date  . Seizures    BP 101/67  Pulse 72  Resp 14  Ht 6\' 4"  (1.93 m)  Wt 203 lb (92.08  kg)  BMI 24.72 kg/m2  SpO2 95%     Review of Systems  Gastrointestinal: Positive for diarrhea and constipation.  All other systems reviewed and are negative.       Objective:   Physical Exam  Constitutional: He is oriented to person, place, and time. He appears well-developed and well-nourished.  HENT:  Head: chronic facial/scalp wounds/scarring noted. Right Ear: External ear normal.  Left Ear: External ear normal.  Mouth/Throat: Oropharynx is clear and moist.  Eyes: Conjunctivae and EOM are normal. Pupils are equal, round, and reactive to light.  Neck: Normal range of motion. Neck supple.  Cardiovascular: Normal rate and regular rhythm.  Pulmonary/Chest: Effort normal and breath sounds normal. No respiratory distress. He has no wheezes.  Abdominal: Soft.  Neurological: He is alert and oriented to person, place, and time.  Left eye enucleated. Good insight and awareness. Still with short term memory and attentional deficits.  Balance is good. Strength is 5/5. Gait is normal.  Skin: Skin is warm.  Psychiatric: He has a normal mood and affect. His behavior is normal. Judgment and thought content normal.    Assessment & Plan:   ASSESSMENT:  1. Traumatic brain injury, left eye enucleation.  2. 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  3. Seizure disorder.  4. Insomnia.    PLAN:  1. Made a referral to [place] [facility] for consideration of volunteer work, ?part-time vocational re-entry  2. Continue with the Keppra for seizure  prophylaxis.  3. Continue with the melatonin along with trazodone for sleep. Will hold on a trazodone wean for now. 4. All questions were encouraged and answered.  I will see him back in 6 months.

## 2013-10-30 ENCOUNTER — Other Ambulatory Visit: Payer: Self-pay | Admitting: Physical Medicine & Rehabilitation

## 2013-11-27 ENCOUNTER — Other Ambulatory Visit: Payer: Self-pay | Admitting: Physical Medicine & Rehabilitation

## 2014-01-10 ENCOUNTER — Other Ambulatory Visit: Payer: Self-pay | Admitting: Physical Medicine & Rehabilitation

## 2014-02-17 ENCOUNTER — Emergency Department (HOSPITAL_COMMUNITY)
Admission: EM | Admit: 2014-02-17 | Discharge: 2014-02-18 | Disposition: A | Discharge status: Home / Self Care | Payer: Medicare Other | Attending: Emergency Medicine | Admitting: Emergency Medicine

## 2014-02-17 ENCOUNTER — Encounter (HOSPITAL_COMMUNITY): Payer: Self-pay | Admitting: Emergency Medicine

## 2014-02-17 ENCOUNTER — Emergency Department (HOSPITAL_COMMUNITY): Payer: Medicare Other

## 2014-02-17 DIAGNOSIS — G40909 Epilepsy, unspecified, not intractable, without status epilepticus: Secondary | ICD-10-CM | POA: Insufficient documentation

## 2014-02-17 DIAGNOSIS — Z87891 Personal history of nicotine dependence: Secondary | ICD-10-CM | POA: Insufficient documentation

## 2014-02-17 DIAGNOSIS — J45909 Unspecified asthma, uncomplicated: Secondary | ICD-10-CM | POA: Diagnosis not present

## 2014-02-17 DIAGNOSIS — Z79899 Other long term (current) drug therapy: Secondary | ICD-10-CM | POA: Diagnosis not present

## 2014-02-17 DIAGNOSIS — J45901 Unspecified asthma with (acute) exacerbation: Secondary | ICD-10-CM | POA: Diagnosis not present

## 2014-02-17 DIAGNOSIS — Z87820 Personal history of traumatic brain injury: Secondary | ICD-10-CM | POA: Diagnosis not present

## 2014-02-17 HISTORY — DX: Unspecified asthma, uncomplicated: J45.909

## 2014-02-17 HISTORY — DX: Unspecified intracranial injury with loss of consciousness of unspecified duration, initial encounter: S06.9X9A

## 2014-02-17 MED ORDER — ALBUTEROL SULFATE (2.5 MG/3ML) 0.083% IN NEBU
5.0000 mg | INHALATION_SOLUTION | Freq: Once | RESPIRATORY_TRACT | Status: AC
  Administered 2014-02-17: 5 mg via RESPIRATORY_TRACT
  Filled 2014-02-17: qty 6

## 2014-02-17 NOTE — ED Notes (Signed)
Pt states that he has been out of asthma medication for 2 years and has not had an asthma attack for that long as well. Pt states that he has been having difficulty breathing for a week (ever since the rain.)

## 2014-02-18 MED ORDER — ALBUTEROL SULFATE HFA 108 (90 BASE) MCG/ACT IN AERS: 2.0000 | INHALATION_SPRAY | RESPIRATORY_TRACT | Status: AC | PRN

## 2014-02-18 MED ORDER — PREDNISONE 20 MG PO TABS
60.0000 mg | ORAL_TABLET | Freq: Once | ORAL | Status: AC
  Administered 2014-02-18: 60 mg via ORAL
  Filled 2014-02-18: qty 3

## 2014-02-18 MED ORDER — PREDNISONE 20 MG PO TABS: 60.0000 mg | ORAL_TABLET | Freq: Every day | ORAL | Status: DC

## 2014-02-18 MED ORDER — ALBUTEROL SULFATE HFA 108 (90 BASE) MCG/ACT IN AERS
2.0000 | INHALATION_SPRAY | Freq: Once | RESPIRATORY_TRACT | Status: AC
  Administered 2014-02-18: 2 via RESPIRATORY_TRACT
  Filled 2014-02-18: qty 6.7

## 2014-02-18 NOTE — Discharge Instructions (Signed)
Asthma, Adult Asthma is a recurring condition in which the airways tighten and narrow. Asthma can make it difficult to breathe. It can cause coughing, wheezing, and shortness of breath. Asthma episodes (also called asthma attacks) range from minor to life-threatening. Asthma cannot be cured, but medicines and lifestyle changes can help control it. CAUSES Asthma is believed to be caused by inherited (genetic) and environmental factors, but its exact cause is unknown. Asthma may be triggered by allergens, lung infections, or irritants in the air. Asthma triggers are different for each person. Common triggers include:   Animal dander.  Dust mites.  Cockroaches.  Pollen from trees or grass.  Mold.  Smoke.  Air pollutants such as dust, household cleaners, hair sprays, aerosol sprays, paint fumes, strong chemicals, or strong odors.  Cold air, weather changes, and winds (which increase molds and pollens in the air).  Strong emotional expressions such as crying or laughing hard.  Stress.  Certain medicines (such as aspirin) or types of drugs (such as beta-blockers).  Sulfites in foods and drinks. Foods and drinks that may contain sulfites include dried fruit, potato chips, and sparkling grape juice.  Infections or inflammatory conditions such as the flu, a cold, or an inflammation of the nasal membranes (rhinitis).  Gastroesophageal reflux disease (GERD).  Exercise or strenuous activity. SYMPTOMS Symptoms may occur immediately after asthma is triggered or many hours later. Symptoms include:  Wheezing.  Excessive nighttime or early morning coughing.  Frequent or severe coughing with a common cold.  Chest tightness.  Shortness of breath. DIAGNOSIS  The diagnosis of asthma is made by a review of your medical history and a physical exam. Tests may also be performed. These may include:  Lung function studies. These tests show how much air you breath in and out.  Allergy  tests.  Imaging tests such as X-rays. TREATMENT  Asthma cannot be cured, but it can usually be controlled. Treatment involves identifying and avoiding your asthma triggers. It also involves medicines. There are 2 classes of medicine used for asthma treatment:   Controller medicines. These prevent asthma symptoms from occurring. They are usually taken every day.  Reliever or rescue medicines. These quickly relieve asthma symptoms. They are used as needed and provide short-term relief. Your health care provider will help you create an asthma action plan. An asthma action plan is a written plan for managing and treating your asthma attacks. It includes a list of your asthma triggers and how they may be avoided. It also includes information on when medicines should be taken and when their dosage should be changed. An action plan may also involve the use of a device called a peak flow meter. A peak flow meter measures how well the lungs are working. It helps you monitor your condition. HOME CARE INSTRUCTIONS   Take medicine as directed by your health care provider. Speak with your health care provider if you have questions about how or when to take the medicines.  Use a peak flow meter as directed by your health care provider. Record and keep track of readings.  Understand and use the action plan to help minimize or stop an asthma attack without needing to seek medical care.  Control your home environment in the following ways to help prevent asthma attacks:  Do not smoke. Avoid being exposed to secondhand smoke.  Change your heating and air conditioning filter regularly.  Limit your use of fireplaces and wood stoves.  Get rid of pests (such as roaches and   mice) and their droppings.  Throw away plants if you see mold on them.  Clean your floors and dust regularly. Use unscented cleaning products.  Try to have someone else vacuum for you regularly. Stay out of rooms while they are being  vacuumed and for a short while afterward. If you vacuum, use a dust mask from a hardware store, a double-layered or microfilter vacuum cleaner bag, or a vacuum cleaner with a HEPA filter.  Replace carpet with wood, tile, or vinyl flooring. Carpet can trap dander and dust.  Use allergy-proof pillows, mattress covers, and box spring covers.  Wash bed sheets and blankets every week in hot water and dry them in a dryer.  Use blankets that are made of polyester or cotton.  Clean bathrooms and kitchens with bleach. If possible, have someone repaint the walls in these rooms with mold-resistant paint. Keep out of the rooms that are being cleaned and painted.  Wash hands frequently. SEEK MEDICAL CARE IF:   You have wheezing, shortness of breath, or a cough even if taking medicine to prevent attacks.  The colored mucus you cough up (sputum) is thicker than usual.  Your sputum changes from clear or white to yellow, green, gray, or bloody.  You have any problems that may be related to the medicines you are taking (such as a rash, itching, swelling, or trouble breathing).  You are using a reliever medicine more than 2 3 times per week.  Your peak flow is still at 50 79% of you personal best after following your action plan for 1 hour. SEEK IMMEDIATE MEDICAL CARE IF:   You seem to be getting worse and are unresponsive to treatment during an asthma attack.  You are short of breath even at rest.  You get short of breath when doing very little physical activity.  You have difficulty eating, drinking, or talking due to asthma symptoms.  You develop chest pain.  You develop a fast heartbeat.  You have a bluish color to your lips or fingernails.  You are lightheaded, dizzy, or faint.  Your peak flow is less than 50% of your personal best.  You have a fever or persistent symptoms for more than 2 3 days.  You have a fever and symptoms suddenly get worse. MAKE SURE YOU:   Understand these  instructions.  Will watch your condition.  Will get help right away if you are not doing well or get worse. Document Released: 10/18/2005 Document Revised: 06/20/2013 Document Reviewed: 05/17/2013 Exit[employer] Patient Information 2014 [employer].  

## 2014-02-18 NOTE — ED Provider Notes (Signed)
Medical screening examination/treatment/procedure(s) were performed by non-physician practitioner and as supervising physician I was immediately available for consultation/collaboration.   EKG Interpretation None        Annalina Needles, MD 02/18/14 0533 

## 2014-02-18 NOTE — ED Provider Notes (Signed)
CSN: [id number]     Arrival date & time 02/17/14  2213 History   First MD Initiated Contact with Patient 02/17/14 2352     Chief Complaint  Patient presents with  . Shortness of Breath  . Asthma     (Consider location/radiation/quality/duration/timing/severity/associated sxs/prior Treatment) HPI Comments: Patient is 38 year old male with PMH significant for Brain surgery and asthma who presents to the ED with a week history of chest tightness, shortness of breath and wheezing.  He states that he has not had to use an inhaler for the past 2 years, no longer has one.  Has been trying OTC medication.  Denies fever, chills, headache, nausea, vomiting, has a cough but is non-productive.  Denies chest pain.  Patient is a 38 y.o. male presenting with shortness of breath and asthma. The history is provided by the patient. No language interpreter was used.  Shortness of Breath Severity:  Moderate Onset quality:  Gradual Duration:  1 week Timing:  Intermittent Progression:  Worsening Chronicity:  Chronic Context: weather changes   Relieved by:  Nothing Worsened by:  Nothing tried Ineffective treatments:  None tried Associated symptoms: cough and wheezing   Associated symptoms: no abdominal pain, no chest pain, no fever, no hemoptysis, no neck pain, no rash, no sore throat, no sputum production and no swollen glands   Asthma Associated symptoms include coughing. Pertinent negatives include no abdominal pain, chest pain, fever, neck pain, rash, sore throat or swollen glands.    Past Medical History  Diagnosis Date  . Seizures   . Asthma   . TBI (traumatic brain injury)    Past Surgical History  Procedure Laterality Date  . Brain surgery    . Craniotomy     History reviewed. No pertinent family history. History  Substance Use Topics  . Smoking status: Former Games developermoker  . Smokeless tobacco: Never Used  . Alcohol Use: Yes    Review of Systems  Constitutional: Negative for fever.   HENT: Negative for sore throat.   Respiratory: Positive for cough, shortness of breath and wheezing. Negative for hemoptysis and sputum production.   Cardiovascular: Negative for chest pain.  Gastrointestinal: Negative for abdominal pain.  Musculoskeletal: Negative for neck pain.  Skin: Negative for rash.  All other systems reviewed and are negative.     Allergies  Morphine and related  Home Medications   Prior to Admission medications   Medication Sig Start Date End Date Taking? Authorizing Provider  levETIRAcetam (KEPPRA XR) 500 MG 24 hr tablet TAKE 2 TABLETS BY MOUTH DAILY 10/03/13   Ranelle OysterZachary T Swartz, MD  MELATONIN ER PO Take by mouth.    Historical Provider, MD  traZODone (DESYREL) 150 MG tablet TAKE 1/2 TO 1 TABLET BY MOUTH DAILY AT BEDTIME .... TRY TO WEAN OFF 01/10/14   Ranelle OysterZachary T Swartz, MD   BP 121/76  Pulse 88  Temp(Src) 97.7 F (36.5 C) (Oral)  Resp 20  Ht 6\' 4"  (1.93 m)  Wt 202 lb 1 oz (91.655 kg)  BMI 24.61 kg/m2  SpO2 95% Physical Exam  Nursing note and vitals reviewed. Constitutional: He is oriented to person, place, and time. He appears well-developed and well-nourished. No distress.  HENT:  Head: Atraumatic.  Right Ear: External ear normal.  Left Ear: External ear normal.  Nose: Nose normal.  Mouth/Throat: Oropharynx is clear and moist. No oropharyngeal exudate.  Large well healed craniotomy scar and frontal bone deformity  Eyes: Conjunctivae are normal. Pupils are equal, round, and  reactive to light. No scleral icterus.  Neck: Normal range of motion. Neck supple.  Cardiovascular: Normal rate, regular rhythm and normal heart sounds.  Exam reveals no gallop and no friction rub.   No murmur heard. Pulmonary/Chest: Effort normal and breath sounds normal. No respiratory distress. He has no wheezes. He has no rales. He exhibits no tenderness.  Abdominal: Soft. Bowel sounds are normal. He exhibits no distension. There is no tenderness.  Musculoskeletal: Normal  range of motion. He exhibits no edema and no tenderness.  Lymphadenopathy:    He has no cervical adenopathy.  Neurological: He is alert and oriented to person, place, and time. He exhibits normal muscle tone. Coordination normal.  Skin: Skin is warm and dry. No rash noted. No erythema. No pallor.  Psychiatric: He has a normal mood and affect. His behavior is normal. Judgment and thought content normal.    ED Course  Procedures (including critical care time) Labs Review Labs Reviewed - No data to display  Imaging Review Dg Chest 2 View  02/18/2014   CLINICAL DATA:  Asthma, shortness of breath  EXAM: CHEST  2 VIEW  COMPARISON:  Prior radiograph from 05/12/2009  FINDINGS: Shunt catheter tubing overlies the right neck and right chest. Cardiac and mediastinal silhouettes are within normal limits.  Lungs are normally inflated. Mild diffuse peribronchial thickening present, like related asthma. No focal infiltrates identified. No pulmonary edema or pleural effusion. No pneumothorax.  No acute osseous abnormality.  IMPRESSION: Mild diffuse peribronchial thickening, likely related to patient history of asthma. No focal infiltrates or other acute cardiopulmonary abnormality identified.   Electronically Signed   By: Rise MuBenjamin  McClintock M.D.   On: 02/18/2014 00:01     EKG Interpretation None      MDM  Asthma Exacerbation  Patient here with acute asthma exacerbation, afebrile - chest x-ray negative, started on oral steroids here and given an albuterol inhaler.    Izola PriceFrances C. Marisue HumbleSanford, PA-C 02/18/14 0015

## 2014-04-03 ENCOUNTER — Encounter: Payer: Medicare Other | Attending: Physical Medicine & Rehabilitation | Admitting: Physical Medicine & Rehabilitation

## 2014-04-29 ENCOUNTER — Other Ambulatory Visit: Payer: Self-pay | Admitting: Physical Medicine & Rehabilitation

## 2014-05-31 ENCOUNTER — Encounter
Payer: Worker's Compensation | Attending: Physical Medicine & Rehabilitation | Admitting: Physical Medicine & Rehabilitation

## 2014-05-31 ENCOUNTER — Encounter: Payer: Self-pay | Admitting: Physical Medicine & Rehabilitation

## 2014-05-31 VITALS — BP 132/72 | HR 74 | Resp 14 | Ht 76.0 in | Wt 195.0 lb

## 2014-05-31 DIAGNOSIS — Z79899 Other long term (current) drug therapy: Secondary | ICD-10-CM | POA: Diagnosis not present

## 2014-05-31 DIAGNOSIS — G40909 Epilepsy, unspecified, not intractable, without status epilepticus: Secondary | ICD-10-CM | POA: Diagnosis not present

## 2014-05-31 DIAGNOSIS — J45909 Unspecified asthma, uncomplicated: Secondary | ICD-10-CM | POA: Diagnosis not present

## 2014-05-31 DIAGNOSIS — S069XAS Unspecified intracranial injury with loss of consciousness status unknown, sequela: Secondary | ICD-10-CM | POA: Insufficient documentation

## 2014-05-31 DIAGNOSIS — Y33XXXS Other specified events, undetermined intent, sequela: Secondary | ICD-10-CM | POA: Insufficient documentation

## 2014-05-31 DIAGNOSIS — S069X9S Unspecified intracranial injury with loss of consciousness of unspecified duration, sequela: Secondary | ICD-10-CM

## 2014-05-31 DIAGNOSIS — S069X5S Unspecified intracranial injury with loss of consciousness greater than 24 hours with return to pre-existing conscious level, sequela: Secondary | ICD-10-CM

## 2014-05-31 DIAGNOSIS — G4700 Insomnia, unspecified: Secondary | ICD-10-CM | POA: Insufficient documentation

## 2014-05-31 MED ORDER — LEVETIRACETAM ER 500 MG PO TB24: ORAL_TABLET | ORAL | Status: DC

## 2014-05-31 MED ORDER — TRAZODONE HCL 150 MG PO TABS: ORAL_TABLET | ORAL | Status: DC

## 2014-05-31 NOTE — Progress Notes (Signed)
Subjective:    Patient ID: Mark Chambers, male    DOB: Nov 14, 1975, 38 y.o.   MRN: [id number]  HPI  Mark Chambers is back regarding his TBI. I last saw him in December. He states that things are going "ok".   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 His brother is going to be teaching him how to use the machines later this year.   Otherwise, he likes to shoot pool. He does a little but of exercise and working out also. Appetite has been good. He has lost 10lbs by trying to eat better.   He reports no seizures. He remains on keppra. He is no longer on focalin which he stopped some time ago. His pain is controlled---no better or worse  He is taking trazodone which helps his sleep. He is currently out.   He just returned from [place] after taking his mother to the airport.     Pain Inventory Average Pain 3 Pain Right Now 1 My pain is dull  In the last 24 hours, has pain interfered with the following? General activity 2 Relation with others 3 Enjoyment of life 4 What TIME of day is your pain at its worst? night Sleep (in general) Fair  Pain is worse with: positioning Pain improves with: rest Relief from Meds: na  Mobility walk without assistance ability to climb steps?  no do you drive?  yes transfers alone  Function disabled: date disabled na I need assistance with the following:  household duties and shopping  Neuro/Psych No problems in this area  Prior Studies Any changes since last visit?  no  Physicians involved in your care Any changes since last visit?  no   History reviewed. No pertinent family history. History   Social History  . Marital Status: Single    Spouse Name: N/A    Number of Children: N/A  . Years of Education: N/A   Social History Main Topics  . Smoking status: Former Games developer  . Smokeless tobacco: Never Used  . Alcohol Use:  Yes  . Drug Use: No  . Sexual Activity: None   Other Topics Concern  . None   Social History Narrative  . None   Past Surgical History  Procedure Laterality Date  . Brain surgery    . Craniotomy     Past Medical History  Diagnosis Date  . Seizures   . Asthma   . TBI (traumatic brain injury)    BP 132/72  Pulse 74  Resp 14  Ht 6\' 4"  (1.93 m)  Wt 195 lb (88.451 kg)  BMI 23.75 kg/m2  SpO2 97%  Opioid Risk Score:   Fall Risk Score: High Fall Risk (>13 points) (pt edcuated on fall risk, brochure given to pt)    Review of Systems  Constitutional: Positive for unexpected weight change.  All other systems reviewed and are negative.      Objective:   Physical Exam  Constitutional: he appears fatigued.  HENT:  Head: chronic facial/scalp wounds/scarring noted. Right Ear: External ear normal.  Left Ear: External ear normal.  Mouth/Throat: Oropharynx is clear and moist.  Eyes: Conjunctivae and EOM are normal. Pupils are equal, round, and reactive to light.  Neck: Normal range of motion. Neck supple.  Cardiovascular: Normal rate and regular rhythm.  Pulmonary/Chest: Effort normal and breath sounds normal. No respiratory distress. He has no wheezes.  Abdominal: Soft.  Neurological: He is alert and oriented to person, place, and time.  Left eye enucleated. improved insight and awareness. Still with short term memory and attentional deficits. More difficulties with attention today. Balance is good. Strength is 5/5. Gait is normal.  Skin: Skin is warm.  Psychiatric: He has a normal mood and affect. His behavior is normal. Judgment and thought content normal.    Assessment & Plan:   ASSESSMENT:  1. Traumatic brain injury, left eye enucleation.  2. 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  3. Seizure disorder.  4. Insomnia.    PLAN:  1.Discussed the maintenance and growth of a daily routine. 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 He will have some limitations however given his cognition 2. Continue with the Keppra for seizure  Prophylaxis. refilled 3. Continue with the melatonin along with trazodone for sleep. Trazodone was refilled.  4. All questions were encouraged and answered. I will see him back in 12 months. 15 minutes of face to face patient care time were spent during this visit. All questions were encouraged and answered.

## 2014-05-31 NOTE — Patient Instructions (Signed)
CONTINUE TO WORK ON VOCATIONAL AND EXERCISE ROUTINES  SHOOT FOR 8-10 HOURS OF SLEEP PER NIGHT

## 2014-06-04 ENCOUNTER — Encounter (HOSPITAL_COMMUNITY): Payer: Self-pay | Admitting: Emergency Medicine

## 2014-06-04 ENCOUNTER — Emergency Department (HOSPITAL_COMMUNITY): Payer: Medicare Other

## 2014-06-04 ENCOUNTER — Emergency Department (HOSPITAL_COMMUNITY)
Admission: EM | Admit: 2014-06-04 | Discharge: 2014-06-04 | Disposition: A | Discharge status: Home / Self Care | Payer: Medicare Other | Attending: Emergency Medicine | Admitting: Emergency Medicine

## 2014-06-04 DIAGNOSIS — K625 Hemorrhage of anus and rectum: Secondary | ICD-10-CM | POA: Diagnosis not present

## 2014-06-04 DIAGNOSIS — Z87820 Personal history of traumatic brain injury: Secondary | ICD-10-CM | POA: Diagnosis not present

## 2014-06-04 DIAGNOSIS — J45909 Unspecified asthma, uncomplicated: Secondary | ICD-10-CM | POA: Diagnosis not present

## 2014-06-04 DIAGNOSIS — K649 Unspecified hemorrhoids: Secondary | ICD-10-CM | POA: Diagnosis not present

## 2014-06-04 DIAGNOSIS — Z87891 Personal history of nicotine dependence: Secondary | ICD-10-CM | POA: Insufficient documentation

## 2014-06-04 DIAGNOSIS — M25559 Pain in unspecified hip: Secondary | ICD-10-CM | POA: Diagnosis not present

## 2014-06-04 DIAGNOSIS — K648 Other hemorrhoids: Secondary | ICD-10-CM

## 2014-06-04 DIAGNOSIS — K644 Residual hemorrhoidal skin tags: Secondary | ICD-10-CM | POA: Diagnosis not present

## 2014-06-04 DIAGNOSIS — G40909 Epilepsy, unspecified, not intractable, without status epilepticus: Secondary | ICD-10-CM | POA: Insufficient documentation

## 2014-06-04 DIAGNOSIS — Z79899 Other long term (current) drug therapy: Secondary | ICD-10-CM | POA: Insufficient documentation

## 2014-06-04 NOTE — Discharge Instructions (Signed)
You were seen in the emergency department for hemorrhoids. You may alternate between Tylenol 1000 mg every 6 hours and ibuprofen 800 mg every 8 hours as needed for pain. Please use Preparation H and Witch Hazel pads (such as Tuck's) on her hemorrhoids to help with pain and swelling. You should also continue to drink plenty of water in a food high in fiber and may take over-the-counter MiraLAX once daily and Colace 100 mg tablets twice daily to keep her stool soft. If you're hemorrhoids or not improving after one to 2 weeks, he may talk to general surgery who may perform a hemorrhoidectomy.   Hemorrhoids Hemorrhoids are swollen veins around the rectum or anus. There are two types of hemorrhoids:   Internal hemorrhoids. These occur in the veins just inside the rectum. They may poke through to the outside and become irritated and painful.  External hemorrhoids. These occur in the veins outside the anus and can be felt as a painful swelling or hard lump near the anus. CAUSES  Pregnancy.   Obesity.   Constipation or diarrhea.   Straining to have a bowel movement.   Sitting for long periods on the toilet.  Heavy lifting or other activity that caused you to strain.  Anal intercourse. SYMPTOMS   Pain.   Anal itching or irritation.   Rectal bleeding.   Fecal leakage.   Anal swelling.   One or more lumps around the anus.  DIAGNOSIS  Your caregiver may be able to diagnose hemorrhoids by visual examination. Other examinations or tests that may be performed include:   Examination of the rectal area with a gloved hand (digital rectal exam).   Examination of anal canal using a small tube (scope).   A blood test if you have lost a significant amount of blood.  A test to look inside the colon (sigmoidoscopy or colonoscopy). TREATMENT Most hemorrhoids can be treated at home. However, if symptoms do not seem to be getting better or if you have a lot of rectal bleeding, your  caregiver may perform a procedure to help make the hemorrhoids get smaller or remove them completely. Possible treatments include:   Placing a rubber band at the base of the hemorrhoid to cut off the circulation (rubber band ligation).   Injecting a chemical to shrink the hemorrhoid (sclerotherapy).   Using a tool to burn the hemorrhoid (infrared light therapy).   Surgically removing the hemorrhoid (hemorrhoidectomy).   Stapling the hemorrhoid to block blood flow to the tissue (hemorrhoid stapling).  HOME CARE INSTRUCTIONS   Eat foods with fiber, such as whole grains, beans, nuts, fruits, and vegetables. Ask your doctor about taking products with added fiber in them (fibersupplements).  Increase fluid intake. Drink enough water and fluids to keep your urine clear or pale yellow.   Exercise regularly.   Go to the bathroom when you have the urge to have a bowel movement. Do not wait.   Avoid straining to have bowel movements.   Keep the anal area dry and clean. Use wet toilet paper or moist towelettes after a bowel movement.   Medicated creams and suppositories may be used or applied as directed.   Only take over-the-counter or prescription medicines as directed by your caregiver.   Take warm sitz baths for 15-20 minutes, 3-4 times a day to ease pain and discomfort.   Place ice packs on the hemorrhoids if they are tender and swollen. Using ice packs between sitz baths may be helpful.   Put  ice in a plastic bag.   Place a towel between your skin and the bag.   Leave the ice on for 15-20 minutes, 3-4 times a day.   Do not use a donut-shaped pillow or sit on the toilet for long periods. This increases blood pooling and pain.  SEEK MEDICAL CARE IF:  You have increasing pain and swelling that is not controlled by treatment or medicine.  You have uncontrolled bleeding.  You have difficulty or you are unable to have a bowel movement.  You have pain or  inflammation outside the area of the hemorrhoids. MAKE SURE YOU:  Understand these instructions.  Will watch your condition.  Will get help right away if you are not doing well or get worse. Document Released: 10/15/2000 Document Revised: 10/04/2012 Document Reviewed: 08/22/2012 [facility] Patient Information 2015 [place], [place]. This information is not intended to replace advice given to you by your health care provider. Make sure you discuss any questions you have with your health care provider.   High-Fiber Diet Fiber is found in fruits, vegetables, and grains. A high-fiber diet encourages the addition of more whole grains, legumes, fruits, and vegetables in your diet. The recommended amount of fiber for adult males is 38 g per day. For adult females, it is 25 g per day. Pregnant and lactating women should get 28 g of fiber per day. If you have a digestive or bowel problem, ask your caregiver for advice before adding high-fiber foods to your diet. Eat a variety of high-fiber foods instead of only a select few type of foods.  PURPOSE  To increase stool bulk.  To make bowel movements more regular to prevent constipation.  To lower cholesterol.  To prevent overeating. WHEN IS THIS DIET USED?  It may be used if you have constipation and hemorrhoids.  It may be used if you have uncomplicated diverticulosis (intestine condition) and irritable bowel syndrome.  It may be used if you need help with weight management.  It may be used if you want to add it to your diet as a protective measure against atherosclerosis, diabetes, and cancer. SOURCES OF FIBER  Whole-grain breads and cereals.  Fruits, such as apples, oranges, bananas, berries, prunes, and pears.  Vegetables, such as green peas, carrots, sweet potatoes, beets, broccoli, cabbage, spinach, and artichokes.  Legumes, such split peas, soy, lentils.  Almonds. FIBER CONTENT IN FOODS Starches and Grains / Dietary Fiber  (g)  Cheerios, 1 cup / 3 g  Corn Flakes cereal, 1 cup / 0.7 g  Rice crispy treat cereal, 1 cup / 0.3 g  Instant oatmeal (cooked),  cup / 2 g  Frosted wheat cereal, 1 cup / 5.1 g  Brown, long-grain rice (cooked), 1 cup / 3.5 g  White, long-grain rice (cooked), 1 cup / 0.6 g  Enriched macaroni (cooked), 1 cup / 2.5 g Legumes / Dietary Fiber (g)  Baked beans (canned, plain, or vegetarian),  cup / 5.2 g  Kidney beans (canned),  cup / 6.8 g  Pinto beans (cooked),  cup / 5.5 g Breads and Crackers / Dietary Fiber (g)  Plain or honey graham crackers, 2 squares / 0.7 g  Saltine crackers, 3 squares / 0.3 g  Plain, salted pretzels, 10 pieces / 1.8 g  Whole-wheat bread, 1 slice / 1.9 g  White bread, 1 slice / 0.7 g  Raisin bread, 1 slice / 1.2 g  Plain bagel, 3 oz / 2 g  Flour tortilla, 1 oz / 0.9  g  Corn tortilla, 1 small / 1.5 g  Hamburger or hotdog bun, 1 small / 0.9 g Fruits / Dietary Fiber (g)  Apple with skin, 1 medium / 4.4 g  Sweetened applesauce,  cup / 1.5 g  Banana,  medium / 1.5 g  Grapes, 10 grapes / 0.4 g  Orange, 1 small / 2.3 g  Raisin, 1.5 oz / 1.6 g  Melon, 1 cup / 1.4 g Vegetables / Dietary Fiber (g)  Green beans (canned),  cup / 1.3 g  Carrots (cooked),  cup / 2.3 g  Broccoli (cooked),  cup / 2.8 g  Peas (cooked),  cup / 4.4 g  Mashed potatoes,  cup / 1.6 g  Lettuce, 1 cup / 0.5 g  Corn (canned),  cup / 1.6 g  Tomato,  cup / 1.1 g Document Released: 10/18/2005 Document Revised: 04/18/2012 Document Reviewed: 01/20/2012 [employer] Patient Information 2015 [employer] [place]. This information is not intended to replace advice given to you by your health care provider. Make sure you discuss any questions you have with your health care provider.

## 2014-06-04 NOTE — ED Notes (Addendum)
Pt here due to hemorrhoids, [place] stating he could not make it to dr appt at 1730. Pt also c/o rectal bleeding.

## 2014-06-04 NOTE — ED Provider Notes (Signed)
TIME SEEN: 9:10 PM  CHIEF COMPLAINT: Hemorrhoids  HPI: Pt is a 38 y.o. M with history of seizures on Keppra, asthma, prior traumatic brain injury presents emergency department with several weeks of hemorrhoids. He states that he is here because he would like a medication to get rid of his hemorrhoids. He states that today he was having a hard time having a bowel movement secondary to pain due to his hemorrhoids and he took a laxative. He was at the mall with his friends when he had a bowel movement that had a blood in his stool and he was concerned so came to the emergency department. He denies any fevers, chills, nausea or vomiting, abdominal pain or distention, melena. He is not on anticoagulation. He states he has had a colonoscopy before was told he had hemorrhoids.  ROS: See HPI Constitutional: no fever  Eyes: no drainage  ENT: no runny nose   Cardiovascular:  no chest pain  Resp: no SOB  GI: no vomiting GU: no dysuria Integumentary: no rash  Allergy: no hives  Musculoskeletal: no leg swelling  Neurological: no slurred speech ROS otherwise negative  PAST MEDICAL HISTORY/PAST SURGICAL HISTORY:  Past Medical History  Diagnosis Date  . Seizures   . Asthma   . TBI (traumatic brain injury)     MEDICATIONS:  Prior to Admission medications   Medication Sig Start Date End Date Taking? Authorizing Provider  albuterol (PROVENTIL HFA;VENTOLIN HFA) 108 (90 BASE) MCG/ACT inhaler Inhale 2 puffs into the lungs every 4 (four) hours as needed for wheezing or shortness of breath. 02/18/14  Yes Scarlette CalicoFrances C. Sanford, PA-C  dexmethylphenidate (FOCALIN XR) 20 MG 24 hr capsule Take by mouth. 07/11/09  Yes Historical Provider, MD  levETIRAcetam (KEPPRA XR) 500 MG 24 hr tablet Take 1,000 mg by mouth 2 (two) times daily. Take two tables twice daily   Yes Historical Provider, MD  Melatonin 1 MG TABS Take 1 tablet by mouth at bedtime.   Yes Historical Provider, MD  traZODone (DESYREL) 150 MG tablet 1/2 to 1  tablet at bedtime 05/31/14  Yes Ranelle OysterZachary T Swartz, MD    ALLERGIES:  Allergies  Allergen Reactions  . Morphine And Related Nausea Only    SOCIAL HISTORY:  History  Substance Use Topics  . Smoking status: Former Games developermoker  . Smokeless tobacco: Never Used  . Alcohol Use: Yes    FAMILY HISTORY: No family history on file.  EXAM: BP 124/84  Pulse 76  Temp(Src) 98 F (36.7 C) (Oral)  Resp 18 CONSTITUTIONAL: Alert and oriented and responds appropriately to questions. Well-appearing; well-nourished HEAD: Normocephalic EYES: Conjunctivae clear, PERRL ENT: normal nose; no rhinorrhea; moist mucous membranes; pharynx without lesions noted NECK: Supple, no meningismus, no LAD  CARD: RRR; S1 and S2 appreciated; no murmurs, no clicks, no rubs, no gallops RESP: Normal chest excursion without splinting or tachypnea; breath sounds clear and equal bilaterally; no wheezes, no rhonchi, no rales,  ABD/GI: Normal bowel sounds; non-distended; soft, non-tender, no rebound, no guarding RECTAL:  Patient has 2 large nonthrombosed external hemorrhoids, small amount of blood from one of the hemorrhoids, no melena, normal rectal tone BACK:  The back appears normal and is non-tender to palpation, there is no CVA tenderness EXT: Normal ROM in all joints; non-tender to palpation; no edema; normal capillary refill; no cyanosis    SKIN: Normal color for age and race; warm NEURO: Moves all extremities equally PSYCH: The patient's mood and manner are appropriate. Grooming and personal hygiene are  appropriate.  MEDICAL DECISION MAKING: Patient here with external hemorrhoids. They are nonthrombosed. No other symptoms. Abdomen is benign. No melena. Hemodynamically stable. Discussed with patient supportive care instructions including using preparation H., alternate Tylenol and Motrin for pain, sitz baths, and foods high in fiber, stool softeners and MiraLAX. Will get outpatient surgery followup information. Discussed  return precautions. He verbalizes understanding and is comfortable with plan.       Layla Maw Ward, DO 06/04/14 2156

## 2014-12-09 ENCOUNTER — Other Ambulatory Visit: Payer: Self-pay | Admitting: Physical Medicine & Rehabilitation

## 2015-01-28 ENCOUNTER — Other Ambulatory Visit: Payer: Self-pay | Admitting: Physical Medicine & Rehabilitation

## 2015-04-02 DIAGNOSIS — Z13220 Encounter for screening for lipoid disorders: Secondary | ICD-10-CM | POA: Diagnosis not present

## 2015-04-02 DIAGNOSIS — G40909 Epilepsy, unspecified, not intractable, without status epilepticus: Secondary | ICD-10-CM | POA: Diagnosis not present

## 2015-04-02 DIAGNOSIS — Z1389 Encounter for screening for other disorder: Secondary | ICD-10-CM | POA: Diagnosis not present

## 2015-04-02 DIAGNOSIS — F324 Major depressive disorder, single episode, in partial remission: Secondary | ICD-10-CM | POA: Diagnosis not present

## 2015-04-02 DIAGNOSIS — Z0000 Encounter for general adult medical examination without abnormal findings: Secondary | ICD-10-CM | POA: Diagnosis not present

## 2015-04-02 DIAGNOSIS — L700 Acne vulgaris: Secondary | ICD-10-CM | POA: Diagnosis not present

## 2015-04-02 DIAGNOSIS — Z136 Encounter for screening for cardiovascular disorders: Secondary | ICD-10-CM | POA: Diagnosis not present

## 2015-04-02 DIAGNOSIS — J45909 Unspecified asthma, uncomplicated: Secondary | ICD-10-CM | POA: Diagnosis not present

## 2015-04-02 DIAGNOSIS — H5442 Blindness, left eye, normal vision right eye: Secondary | ICD-10-CM | POA: Diagnosis not present

## 2015-04-25 DIAGNOSIS — F324 Major depressive disorder, single episode, in partial remission: Secondary | ICD-10-CM | POA: Diagnosis not present

## 2015-04-25 DIAGNOSIS — Z136 Encounter for screening for cardiovascular disorders: Secondary | ICD-10-CM | POA: Diagnosis not present

## 2015-06-02 ENCOUNTER — Ambulatory Visit: Payer: Self-pay | Admitting: Physical Medicine & Rehabilitation

## 2015-06-03 ENCOUNTER — Encounter: Payer: Medicare Other | Attending: Physical Medicine & Rehabilitation | Admitting: Physical Medicine & Rehabilitation

## 2015-07-28 ENCOUNTER — Other Ambulatory Visit: Payer: Self-pay | Admitting: Physical Medicine & Rehabilitation

## 2015-08-11 ENCOUNTER — Other Ambulatory Visit: Payer: Self-pay | Admitting: Physical Medicine & Rehabilitation

## 2015-08-14 ENCOUNTER — Telehealth: Payer: Self-pay | Admitting: Physical Medicine & Rehabilitation

## 2015-08-14 ENCOUNTER — Other Ambulatory Visit: Payer: Self-pay | Admitting: *Deleted

## 2015-08-14 MED ORDER — TRAZODONE HCL 150 MG PO TABS: ORAL_TABLET | ORAL | Status: DC

## 2015-08-14 NOTE — Telephone Encounter (Signed)
Patient called his pharmacy to get a refill on Trazodone and they said it was denied and patient needed to make an appointment with doctor.  I made an appointment for November 2 with Dr. Riley KillSwartz, this was his next available.

## 2015-08-14 NOTE — Telephone Encounter (Signed)
Refilled one month with note to keep appt 09/03/15 for further refills

## 2015-09-03 ENCOUNTER — Encounter
Payer: Worker's Compensation | Attending: Physical Medicine & Rehabilitation | Admitting: Physical Medicine & Rehabilitation

## 2015-09-03 ENCOUNTER — Encounter: Payer: Self-pay | Admitting: Physical Medicine & Rehabilitation

## 2015-09-03 VITALS — BP 124/63 | HR 57 | Resp 14

## 2015-09-03 DIAGNOSIS — G40909 Epilepsy, unspecified, not intractable, without status epilepticus: Secondary | ICD-10-CM | POA: Insufficient documentation

## 2015-09-03 DIAGNOSIS — F09 Unspecified mental disorder due to known physiological condition: Secondary | ICD-10-CM | POA: Insufficient documentation

## 2015-09-03 DIAGNOSIS — J45909 Unspecified asthma, uncomplicated: Secondary | ICD-10-CM | POA: Diagnosis not present

## 2015-09-03 DIAGNOSIS — X58XXXS Exposure to other specified factors, sequela: Secondary | ICD-10-CM | POA: Insufficient documentation

## 2015-09-03 DIAGNOSIS — G3184 Mild cognitive impairment, so stated: Secondary | ICD-10-CM | POA: Insufficient documentation

## 2015-09-03 DIAGNOSIS — S0572XS Avulsion of left eye, sequela: Secondary | ICD-10-CM | POA: Insufficient documentation

## 2015-09-03 DIAGNOSIS — Z79899 Other long term (current) drug therapy: Secondary | ICD-10-CM | POA: Diagnosis not present

## 2015-09-03 DIAGNOSIS — G3189 Other specified degenerative diseases of nervous system: Secondary | ICD-10-CM

## 2015-09-03 DIAGNOSIS — Z87891 Personal history of nicotine dependence: Secondary | ICD-10-CM | POA: Diagnosis not present

## 2015-09-03 DIAGNOSIS — F419 Anxiety disorder, unspecified: Secondary | ICD-10-CM | POA: Diagnosis not present

## 2015-09-03 DIAGNOSIS — G4700 Insomnia, unspecified: Secondary | ICD-10-CM | POA: Diagnosis not present

## 2015-09-03 DIAGNOSIS — S069X6S Unspecified intracranial injury with loss of consciousness greater than 24 hours without return to pre-existing conscious level with patient surviving, sequela: Secondary | ICD-10-CM | POA: Diagnosis not present

## 2015-09-03 DIAGNOSIS — S069XAS Unspecified intracranial injury with loss of consciousness status unknown, sequela: Secondary | ICD-10-CM | POA: Insufficient documentation

## 2015-09-03 DIAGNOSIS — S069X0S Unspecified intracranial injury without loss of consciousness, sequela: Secondary | ICD-10-CM

## 2015-09-03 MED ORDER — TRAZODONE HCL 100 MG PO TABS: 100.0000 mg | ORAL_TABLET | Freq: Every day | ORAL | Status: DC

## 2015-09-03 MED ORDER — SUVOREXANT 5 MG PO TABS: 5.0000 mg | ORAL_TABLET | Freq: Every day | ORAL | Status: DC
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
## 2015-09-03 NOTE — Patient Instructions (Signed)
CONTINUE TO WORK ON A ROUTINE.

## 2015-09-16 ENCOUNTER — Telehealth: Payer: Self-pay

## 2015-09-16 NOTE — Telephone Encounter (Signed)
Patients [place].  Is it ok to change or go forward with prior authorization?  Please advise.

## 2015-09-16 NOTE — Telephone Encounter (Signed)
i prefer belsomra given his TBI.  Please work on [employer]. thanks

## 2015-09-17 NOTE — Telephone Encounter (Signed)
Patient has [employer] system pharmacy benefits.  Called [phone] to initiate prior authorization.  Belsomra approved.

## 2015-10-10 ENCOUNTER — Other Ambulatory Visit: Payer: Self-pay | Admitting: Physical Medicine & Rehabilitation

## 2015-11-05 ENCOUNTER — Encounter
Payer: Worker's Compensation | Attending: Physical Medicine & Rehabilitation | Admitting: Physical Medicine & Rehabilitation

## 2015-11-05 DIAGNOSIS — Z87891 Personal history of nicotine dependence: Secondary | ICD-10-CM | POA: Insufficient documentation

## 2015-11-05 DIAGNOSIS — Z79899 Other long term (current) drug therapy: Secondary | ICD-10-CM | POA: Insufficient documentation

## 2015-11-05 DIAGNOSIS — J45909 Unspecified asthma, uncomplicated: Secondary | ICD-10-CM | POA: Insufficient documentation

## 2015-11-05 DIAGNOSIS — F419 Anxiety disorder, unspecified: Secondary | ICD-10-CM | POA: Insufficient documentation

## 2015-11-05 DIAGNOSIS — G4700 Insomnia, unspecified: Secondary | ICD-10-CM | POA: Insufficient documentation

## 2015-11-05 DIAGNOSIS — S0572XS Avulsion of left eye, sequela: Secondary | ICD-10-CM | POA: Insufficient documentation

## 2015-11-05 DIAGNOSIS — G40909 Epilepsy, unspecified, not intractable, without status epilepticus: Secondary | ICD-10-CM | POA: Insufficient documentation

## 2015-11-05 DIAGNOSIS — S069X6S Unspecified intracranial injury with loss of consciousness greater than 24 hours without return to pre-existing conscious level with patient surviving, sequela: Secondary | ICD-10-CM | POA: Insufficient documentation

## 2015-12-17 ENCOUNTER — Emergency Department (HOSPITAL_COMMUNITY): Payer: Medicare Other | Admitting: Anesthesiology

## 2015-12-17 ENCOUNTER — Encounter (HOSPITAL_COMMUNITY): Admission: EM | Disposition: A | Discharge status: Skilled Nursing Facility | Payer: Self-pay | Source: Home / Self Care

## 2015-12-17 ENCOUNTER — Emergency Department (HOSPITAL_COMMUNITY): Payer: Medicare Other

## 2015-12-17 ENCOUNTER — Encounter (HOSPITAL_COMMUNITY): Payer: Self-pay

## 2015-12-17 ENCOUNTER — Inpatient Hospital Stay (HOSPITAL_COMMUNITY)
Admission: EM | Admit: 2015-12-17 | Discharge: 2015-12-25 | DRG: 488 | Disposition: A | Discharge status: Skilled Nursing Facility | Payer: Medicare Other | Attending: Orthopedic Surgery | Admitting: Orthopedic Surgery

## 2015-12-17 DIAGNOSIS — Z79899 Other long term (current) drug therapy: Secondary | ICD-10-CM | POA: Diagnosis not present

## 2015-12-17 DIAGNOSIS — Z9001 Acquired absence of eye: Secondary | ICD-10-CM

## 2015-12-17 DIAGNOSIS — G40909 Epilepsy, unspecified, not intractable, without status epilepticus: Secondary | ICD-10-CM | POA: Diagnosis present

## 2015-12-17 DIAGNOSIS — S82141B Displaced bicondylar fracture of right tibia, initial encounter for open fracture type I or II: Secondary | ICD-10-CM | POA: Diagnosis not present

## 2015-12-17 DIAGNOSIS — R569 Unspecified convulsions: Secondary | ICD-10-CM | POA: Diagnosis not present

## 2015-12-17 DIAGNOSIS — S01511A Laceration without foreign body of lip, initial encounter: Secondary | ICD-10-CM | POA: Diagnosis present

## 2015-12-17 DIAGNOSIS — Z9889 Other specified postprocedural states: Secondary | ICD-10-CM | POA: Diagnosis not present

## 2015-12-17 DIAGNOSIS — S82191D Other fracture of upper end of right tibia, subsequent encounter for closed fracture with routine healing: Secondary | ICD-10-CM | POA: Diagnosis not present

## 2015-12-17 DIAGNOSIS — J45909 Unspecified asthma, uncomplicated: Secondary | ICD-10-CM | POA: Diagnosis present

## 2015-12-17 DIAGNOSIS — F339 Major depressive disorder, recurrent, unspecified: Secondary | ICD-10-CM | POA: Diagnosis not present

## 2015-12-17 DIAGNOSIS — S32471A Displaced fracture of medial wall of right acetabulum, initial encounter for closed fracture: Secondary | ICD-10-CM | POA: Diagnosis not present

## 2015-12-17 DIAGNOSIS — Z885 Allergy status to narcotic agent status: Secondary | ICD-10-CM

## 2015-12-17 DIAGNOSIS — Z419 Encounter for procedure for purposes other than remedying health state, unspecified: Secondary | ICD-10-CM

## 2015-12-17 DIAGNOSIS — S82001A Unspecified fracture of right patella, initial encounter for closed fracture: Secondary | ICD-10-CM | POA: Diagnosis not present

## 2015-12-17 DIAGNOSIS — D62 Acute posthemorrhagic anemia: Secondary | ICD-10-CM | POA: Diagnosis not present

## 2015-12-17 DIAGNOSIS — S73004A Unspecified dislocation of right hip, initial encounter: Secondary | ICD-10-CM | POA: Diagnosis not present

## 2015-12-17 DIAGNOSIS — M6281 Muscle weakness (generalized): Secondary | ICD-10-CM | POA: Diagnosis not present

## 2015-12-17 DIAGNOSIS — Z87891 Personal history of nicotine dependence: Secondary | ICD-10-CM

## 2015-12-17 DIAGNOSIS — S32401A Unspecified fracture of right acetabulum, initial encounter for closed fracture: Secondary | ICD-10-CM | POA: Diagnosis present

## 2015-12-17 DIAGNOSIS — S73014A Posterior dislocation of right hip, initial encounter: Secondary | ICD-10-CM | POA: Diagnosis present

## 2015-12-17 DIAGNOSIS — Z9689 Presence of other specified functional implants: Secondary | ICD-10-CM | POA: Diagnosis not present

## 2015-12-17 DIAGNOSIS — S32461A Displaced associated transverse-posterior fracture of right acetabulum, initial encounter for closed fracture: Principal | ICD-10-CM | POA: Diagnosis present

## 2015-12-17 DIAGNOSIS — R262 Difficulty in walking, not elsewhere classified: Secondary | ICD-10-CM | POA: Diagnosis not present

## 2015-12-17 DIAGNOSIS — S82201A Unspecified fracture of shaft of right tibia, initial encounter for closed fracture: Secondary | ICD-10-CM | POA: Diagnosis not present

## 2015-12-17 DIAGNOSIS — S32401D Unspecified fracture of right acetabulum, subsequent encounter for fracture with routine healing: Secondary | ICD-10-CM | POA: Diagnosis not present

## 2015-12-17 DIAGNOSIS — S82191A Other fracture of upper end of right tibia, initial encounter for closed fracture: Secondary | ICD-10-CM | POA: Diagnosis not present

## 2015-12-17 DIAGNOSIS — D72829 Elevated white blood cell count, unspecified: Secondary | ICD-10-CM | POA: Diagnosis not present

## 2015-12-17 DIAGNOSIS — S72001A Fracture of unspecified part of neck of right femur, initial encounter for closed fracture: Secondary | ICD-10-CM

## 2015-12-17 DIAGNOSIS — J45901 Unspecified asthma with (acute) exacerbation: Secondary | ICD-10-CM | POA: Diagnosis not present

## 2015-12-17 DIAGNOSIS — S83281A Other tear of lateral meniscus, current injury, right knee, initial encounter: Secondary | ICD-10-CM | POA: Diagnosis present

## 2015-12-17 DIAGNOSIS — R413 Other amnesia: Secondary | ICD-10-CM | POA: Diagnosis present

## 2015-12-17 DIAGNOSIS — F329 Major depressive disorder, single episode, unspecified: Secondary | ICD-10-CM | POA: Diagnosis present

## 2015-12-17 DIAGNOSIS — Y92410 Unspecified street and highway as the place of occurrence of the external cause: Secondary | ICD-10-CM | POA: Diagnosis not present

## 2015-12-17 DIAGNOSIS — S32464B Nondisplaced associated transverse-posterior fracture of right acetabulum, initial encounter for open fracture: Secondary | ICD-10-CM | POA: Diagnosis not present

## 2015-12-17 DIAGNOSIS — S83271A Complex tear of lateral meniscus, current injury, right knee, initial encounter: Secondary | ICD-10-CM | POA: Diagnosis not present

## 2015-12-17 DIAGNOSIS — S82141A Displaced bicondylar fracture of right tibia, initial encounter for closed fracture: Secondary | ICD-10-CM | POA: Diagnosis present

## 2015-12-17 DIAGNOSIS — H5442 Blindness, left eye, normal vision right eye: Secondary | ICD-10-CM | POA: Diagnosis not present

## 2015-12-17 DIAGNOSIS — S73004D Unspecified dislocation of right hip, subsequent encounter: Secondary | ICD-10-CM | POA: Diagnosis not present

## 2015-12-17 DIAGNOSIS — S062X0D Diffuse traumatic brain injury without loss of consciousness, subsequent encounter: Secondary | ICD-10-CM | POA: Diagnosis not present

## 2015-12-17 DIAGNOSIS — S82142D Displaced bicondylar fracture of left tibia, subsequent encounter for closed fracture with routine healing: Secondary | ICD-10-CM | POA: Diagnosis not present

## 2015-12-17 DIAGNOSIS — S82031A Displaced transverse fracture of right patella, initial encounter for closed fracture: Secondary | ICD-10-CM | POA: Diagnosis not present

## 2015-12-17 DIAGNOSIS — S32421A Displaced fracture of posterior wall of right acetabulum, initial encounter for closed fracture: Secondary | ICD-10-CM | POA: Diagnosis not present

## 2015-12-17 DIAGNOSIS — Z8669 Personal history of other diseases of the nervous system and sense organs: Secondary | ICD-10-CM | POA: Diagnosis not present

## 2015-12-17 DIAGNOSIS — S32409A Unspecified fracture of unspecified acetabulum, initial encounter for closed fracture: Secondary | ICD-10-CM | POA: Diagnosis not present

## 2015-12-17 DIAGNOSIS — T148XXA Other injury of unspecified body region, initial encounter: Secondary | ICD-10-CM

## 2015-12-17 DIAGNOSIS — M25551 Pain in right hip: Secondary | ICD-10-CM | POA: Diagnosis not present

## 2015-12-17 DIAGNOSIS — S81001A Unspecified open wound, right knee, initial encounter: Secondary | ICD-10-CM | POA: Diagnosis not present

## 2015-12-17 DIAGNOSIS — S32491A Other specified fracture of right acetabulum, initial encounter for closed fracture: Secondary | ICD-10-CM | POA: Diagnosis not present

## 2015-12-17 DIAGNOSIS — T148 Other injury of unspecified body region: Secondary | ICD-10-CM | POA: Diagnosis not present

## 2015-12-17 DIAGNOSIS — S72001D Fracture of unspecified part of neck of right femur, subsequent encounter for closed fracture with routine healing: Secondary | ICD-10-CM | POA: Diagnosis not present

## 2015-12-17 DIAGNOSIS — S82001B Unspecified fracture of right patella, initial encounter for open fracture type I or II: Secondary | ICD-10-CM

## 2015-12-17 DIAGNOSIS — Z87820 Personal history of traumatic brain injury: Secondary | ICD-10-CM | POA: Diagnosis not present

## 2015-12-17 DIAGNOSIS — T07 Unspecified multiple injuries: Secondary | ICD-10-CM | POA: Diagnosis not present

## 2015-12-17 DIAGNOSIS — S82031B Displaced transverse fracture of right patella, initial encounter for open fracture type I or II: Secondary | ICD-10-CM | POA: Diagnosis not present

## 2015-12-17 DIAGNOSIS — S299XXA Unspecified injury of thorax, initial encounter: Secondary | ICD-10-CM | POA: Diagnosis not present

## 2015-12-17 DIAGNOSIS — K5900 Constipation, unspecified: Secondary | ICD-10-CM | POA: Diagnosis not present

## 2015-12-17 DIAGNOSIS — S82142A Displaced bicondylar fracture of left tibia, initial encounter for closed fracture: Secondary | ICD-10-CM

## 2015-12-17 DIAGNOSIS — S82131A Displaced fracture of medial condyle of right tibia, initial encounter for closed fracture: Secondary | ICD-10-CM

## 2015-12-17 DIAGNOSIS — J4520 Mild intermittent asthma, uncomplicated: Secondary | ICD-10-CM | POA: Diagnosis not present

## 2015-12-17 DIAGNOSIS — S72091A Other fracture of head and neck of right femur, initial encounter for closed fracture: Secondary | ICD-10-CM | POA: Diagnosis not present

## 2015-12-17 DIAGNOSIS — S81811A Laceration without foreign body, right lower leg, initial encounter: Secondary | ICD-10-CM | POA: Diagnosis present

## 2015-12-17 DIAGNOSIS — S82001D Unspecified fracture of right patella, subsequent encounter for closed fracture with routine healing: Secondary | ICD-10-CM | POA: Diagnosis not present

## 2015-12-17 HISTORY — PX: HIP CLOSED REDUCTION: SHX983

## 2015-12-17 HISTORY — PX: CLOSED REDUCTION HIP DISLOCATION: SUR221

## 2015-12-17 HISTORY — DX: Other amnesia: R41.3

## 2015-12-17 HISTORY — PX: I&D EXTREMITY: SHX5045

## 2015-12-17 HISTORY — PX: EXTERNAL FIXATION LEG: SHX1549

## 2015-12-17 LAB — COMPREHENSIVE METABOLIC PANEL
ALBUMIN: 4 g/dL (ref 3.5–5.0)
ALT: 49 U/L (ref 17–63)
ANION GAP: 17 — AB (ref 5–15)
AST: 46 U/L — ABNORMAL HIGH (ref 15–41)
Alkaline Phosphatase: 52 U/L (ref 38–126)
BUN: <5 mg/dL — ABNORMAL LOW (ref 6–20)
CO2: 21 mmol/L — AB (ref 22–32)
Calcium: 9.2 mg/dL (ref 8.9–10.3)
Chloride: 104 mmol/L (ref 101–111)
Creatinine, Ser: 1.07 mg/dL (ref 0.61–1.24)
GFR calc Af Amer: >60 mL/min (ref >60)
GFR calc non Af Amer: >60 mL/min (ref >60)
GLUCOSE: 132 mg/dL — AB (ref 65–99)
POTASSIUM: 3.6 mmol/L (ref 3.5–5.1)
SODIUM: 142 mmol/L (ref 135–145)
Total Bilirubin: 1.2 mg/dL (ref 0.3–1.2)
Total Protein: 6.5 g/dL (ref 6.5–8.1)

## 2015-12-17 LAB — PROTIME-INR
INR: 0.97 (ref 0.00–1.49)
PROTHROMBIN TIME: 13.1 s (ref 11.6–15.2)

## 2015-12-17 LAB — SAMPLE TO BLOOD BANK

## 2015-12-17 LAB — CBC
HEMATOCRIT: 42.6 % (ref 39.0–52.0)
HEMOGLOBIN: 14.1 g/dL (ref 13.0–17.0)
MCH: 29.5 pg (ref 26.0–34.0)
MCHC: 33.1 g/dL (ref 30.0–36.0)
MCV: 89.1 fL (ref 78.0–100.0)
Platelets: 130 10*3/uL — ABNORMAL LOW (ref 150–400)
RBC: 4.78 MIL/uL (ref 4.22–5.81)
RDW: 13.6 % (ref 11.5–15.5)
WBC: 10 10*3/uL (ref 4.0–10.5)

## 2015-12-17 LAB — ETHANOL: Alcohol, Ethyl (B): 6 mg/dL — ABNORMAL HIGH (ref <5)

## 2015-12-17 LAB — CDS SEROLOGY

## 2015-12-17 SURGERY — CLOSED REDUCTION, HIP
Anesthesia: General | Site: Leg Upper | Laterality: Right

## 2015-12-17 MED ORDER — CEFAZOLIN SODIUM-DEXTROSE 2-3 GM-% IV SOLR
INTRAVENOUS | Status: AC
  Filled 2015-12-17: qty 50

## 2015-12-17 MED ORDER — HYDROMORPHONE HCL 1 MG/ML IJ SOLN
INTRAMUSCULAR | Status: AC
  Filled 2015-12-17: qty 1

## 2015-12-17 MED ORDER — HYDROMORPHONE HCL 1 MG/ML IJ SOLN
1.0000 mg | INTRAMUSCULAR | Status: DC | PRN
  Administered 2015-12-18 (×2): 1 mg via INTRAVENOUS
  Filled 2015-12-17 (×2): qty 1

## 2015-12-17 MED ORDER — DOCUSATE SODIUM 100 MG PO CAPS
100.0000 mg | ORAL_CAPSULE | Freq: Two times a day (BID) | ORAL | Status: DC
  Administered 2015-12-18 – 2015-12-23 (×11): 100 mg via ORAL
  Filled 2015-12-17 (×11): qty 1

## 2015-12-17 MED ORDER — LIDOCAINE HCL (CARDIAC) 20 MG/ML IV SOLN
INTRAVENOUS | Status: AC
  Filled 2015-12-17: qty 10

## 2015-12-17 MED ORDER — PHENYLEPHRINE HCL 10 MG/ML IJ SOLN
INTRAMUSCULAR | Status: DC | PRN
  Administered 2015-12-17 (×4): 80 ug via INTRAVENOUS

## 2015-12-17 MED ORDER — MIDAZOLAM HCL 2 MG/2ML IJ SOLN
INTRAMUSCULAR | Status: AC
  Filled 2015-12-17: qty 2

## 2015-12-17 MED ORDER — TETANUS-DIPHTH-ACELL PERTUSSIS 5-2.5-18.5 LF-MCG/0.5 IM SUSP
0.5000 mL | Freq: Once | INTRAMUSCULAR | Status: AC
  Administered 2015-12-17: 0.5 mL via INTRAMUSCULAR
  Filled 2015-12-17: qty 0.5

## 2015-12-17 MED ORDER — FENTANYL CITRATE (PF) 100 MCG/2ML IJ SOLN
INTRAMUSCULAR | Status: AC
  Administered 2015-12-17: 50 ug via INTRAVENOUS
  Filled 2015-12-17: qty 2

## 2015-12-17 MED ORDER — ACETAMINOPHEN 325 MG PO TABS: 650.0000 mg | ORAL_TABLET | Freq: Four times a day (QID) | ORAL | Status: DC | PRN

## 2015-12-17 MED ORDER — PROPOFOL 10 MG/ML IV BOLUS
INTRAVENOUS | Status: AC
  Filled 2015-12-17: qty 20

## 2015-12-17 MED ORDER — HYDROMORPHONE HCL 1 MG/ML IJ SOLN
0.2500 mg | INTRAMUSCULAR | Status: DC | PRN
  Administered 2015-12-17 (×4): 0.5 mg via INTRAVENOUS

## 2015-12-17 MED ORDER — SUCCINYLCHOLINE CHLORIDE 20 MG/ML IJ SOLN
INTRAMUSCULAR | Status: DC | PRN
  Administered 2015-12-17: 110 mg via INTRAVENOUS

## 2015-12-17 MED ORDER — EPHEDRINE SULFATE 50 MG/ML IJ SOLN
INTRAMUSCULAR | Status: AC
  Filled 2015-12-17: qty 2

## 2015-12-17 MED ORDER — PROPOFOL 10 MG/ML IV BOLUS
INTRAVENOUS | Status: DC | PRN
  Administered 2015-12-17: 160 mg via INTRAVENOUS

## 2015-12-17 MED ORDER — MIDAZOLAM HCL 2 MG/2ML IJ SOLN
INTRAMUSCULAR | Status: DC | PRN
  Administered 2015-12-17: 2 mg via INTRAVENOUS

## 2015-12-17 MED ORDER — LIDOCAINE HCL (CARDIAC) 20 MG/ML IV SOLN
INTRAVENOUS | Status: DC | PRN
  Administered 2015-12-17: 70 mg via INTRATRACHEAL

## 2015-12-17 MED ORDER — CEFAZOLIN SODIUM-DEXTROSE 2-3 GM-% IV SOLR
INTRAVENOUS | Status: DC | PRN
  Administered 2015-12-17: 2 g via INTRAVENOUS

## 2015-12-17 MED ORDER — METHOCARBAMOL 1000 MG/10ML IJ SOLN
500.0000 mg | Freq: Four times a day (QID) | INTRAVENOUS | Status: DC | PRN
  Filled 2015-12-17: qty 5

## 2015-12-17 MED ORDER — METHOCARBAMOL 500 MG PO TABS
500.0000 mg | ORAL_TABLET | Freq: Four times a day (QID) | ORAL | Status: DC | PRN
Start: 2015-12-17 — End: 2015-12-25
  Administered 2015-12-18 – 2015-12-24 (×9): 500 mg via ORAL
  Filled 2015-12-17 (×9): qty 1

## 2015-12-17 MED ORDER — PROMETHAZINE HCL 25 MG/ML IJ SOLN: 6.2500 mg | INTRAMUSCULAR | Status: DC | PRN

## 2015-12-17 MED ORDER — SODIUM CHLORIDE 0.9 % IR SOLN
Status: DC | PRN
  Administered 2015-12-17: 1000 mL
  Administered 2015-12-17: 3000 mL

## 2015-12-17 MED ORDER — LIDOCAINE HCL (PF) 1 % IJ SOLN
5.0000 mL | Freq: Once | INTRAMUSCULAR | Status: DC
  Filled 2015-12-17: qty 5

## 2015-12-17 MED ORDER — ONDANSETRON HCL 4 MG PO TABS: 4.0000 mg | ORAL_TABLET | Freq: Four times a day (QID) | ORAL | Status: DC | PRN

## 2015-12-17 MED ORDER — BISACODYL 5 MG PO TBEC: 5.0000 mg | DELAYED_RELEASE_TABLET | Freq: Every day | ORAL | Status: DC | PRN

## 2015-12-17 MED ORDER — LACTATED RINGERS IV SOLN
INTRAVENOUS | Status: DC | PRN
  Administered 2015-12-17 (×2): via INTRAVENOUS

## 2015-12-17 MED ORDER — CEFAZOLIN SODIUM-DEXTROSE 2-3 GM-% IV SOLR
2.0000 g | Freq: Four times a day (QID) | INTRAVENOUS | Status: AC
  Administered 2015-12-18 (×3): 2 g via INTRAVENOUS
  Filled 2015-12-17 (×3): qty 50

## 2015-12-17 MED ORDER — METOCLOPRAMIDE HCL 5 MG/ML IJ SOLN: 5.0000 mg | Freq: Three times a day (TID) | INTRAMUSCULAR | Status: DC | PRN

## 2015-12-17 MED ORDER — ENOXAPARIN SODIUM 40 MG/0.4ML ~~LOC~~ SOLN
40.0000 mg | SUBCUTANEOUS | Status: DC
  Administered 2015-12-18 – 2015-12-25 (×7): 40 mg via SUBCUTANEOUS
  Filled 2015-12-17 (×7): qty 0.4

## 2015-12-17 MED ORDER — OXYCODONE HCL 5 MG PO TABS
5.0000 mg | ORAL_TABLET | ORAL | Status: DC | PRN
  Administered 2015-12-18 (×2): 10 mg via ORAL
  Filled 2015-12-17 (×2): qty 2

## 2015-12-17 MED ORDER — SODIUM CHLORIDE 0.9 % IJ SOLN
INTRAMUSCULAR | Status: AC
  Filled 2015-12-17: qty 20

## 2015-12-17 MED ORDER — DEXTROSE-NACL 5-0.45 % IV SOLN
INTRAVENOUS | Status: DC
  Administered 2015-12-18: 08:00:00 via INTRAVENOUS

## 2015-12-17 MED ORDER — ONDANSETRON HCL 4 MG/2ML IJ SOLN
INTRAMUSCULAR | Status: AC
  Filled 2015-12-17: qty 2

## 2015-12-17 MED ORDER — TETANUS-DIPHTHERIA TOXOIDS TD 5-2 LFU IM INJ
0.5000 mL | INJECTION | Freq: Once | INTRAMUSCULAR | Status: DC
  Filled 2015-12-17: qty 0.5

## 2015-12-17 MED ORDER — SUCCINYLCHOLINE CHLORIDE 20 MG/ML IJ SOLN
INTRAMUSCULAR | Status: AC
  Filled 2015-12-17: qty 2

## 2015-12-17 MED ORDER — ONDANSETRON HCL 4 MG/2ML IJ SOLN: 4.0000 mg | Freq: Four times a day (QID) | INTRAMUSCULAR | Status: DC | PRN

## 2015-12-17 MED ORDER — ACETAMINOPHEN 650 MG RE SUPP: 650.0000 mg | Freq: Four times a day (QID) | RECTAL | Status: DC | PRN

## 2015-12-17 MED ORDER — FENTANYL CITRATE (PF) 250 MCG/5ML IJ SOLN
INTRAMUSCULAR | Status: AC
  Filled 2015-12-17: qty 5

## 2015-12-17 MED ORDER — ONDANSETRON HCL 4 MG/2ML IJ SOLN
INTRAMUSCULAR | Status: DC | PRN
  Administered 2015-12-17: 4 mg via INTRAVENOUS

## 2015-12-17 MED ORDER — FENTANYL CITRATE (PF) 100 MCG/2ML IJ SOLN
50.0000 ug | INTRAMUSCULAR | Status: DC | PRN
  Administered 2015-12-17: 50 ug via INTRAVENOUS

## 2015-12-17 MED ORDER — METOCLOPRAMIDE HCL 5 MG PO TABS: 5.0000 mg | ORAL_TABLET | Freq: Three times a day (TID) | ORAL | Status: DC | PRN

## 2015-12-17 MED ORDER — FENTANYL CITRATE (PF) 250 MCG/5ML IJ SOLN
INTRAMUSCULAR | Status: DC | PRN
  Administered 2015-12-17: 150 ug via INTRAVENOUS
  Administered 2015-12-17 (×2): 50 ug via INTRAVENOUS

## 2015-12-17 SURGICAL SUPPLY — 71 items
BANDAGE ELASTIC 4 VELCRO ST LF (GAUZE/BANDAGES/DRESSINGS) ×5 IMPLANT
BANDAGE ELASTIC 6 VELCRO ST LF (GAUZE/BANDAGES/DRESSINGS) ×10 IMPLANT
BANDAGE ESMARK 6X9 LF (GAUZE/BANDAGES/DRESSINGS) ×3 IMPLANT
BNDG COHESIVE 6X5 TAN STRL LF (GAUZE/BANDAGES/DRESSINGS) ×10 IMPLANT
BNDG ESMARK 6X9 LF (GAUZE/BANDAGES/DRESSINGS) ×5
BNDG GAUZE ELAST 4 BULKY (GAUZE/BANDAGES/DRESSINGS) ×10 IMPLANT
BRUSH SCRUB DISP (MISCELLANEOUS) ×10 IMPLANT
CLEANER TIP ELECTROSURG 2X2 (MISCELLANEOUS) ×5 IMPLANT
CLOSURE WOUND 1/2 X4 (GAUZE/BANDAGES/DRESSINGS)
COVER SURGICAL LIGHT HANDLE (MISCELLANEOUS) ×10 IMPLANT
CUFF TOURNIQUET SINGLE 18IN (TOURNIQUET CUFF) IMPLANT
CUFF TOURNIQUET SINGLE 24IN (TOURNIQUET CUFF) IMPLANT
CUFF TOURNIQUET SINGLE 34IN LL (TOURNIQUET CUFF) IMPLANT
DRAPE C-ARM 42X72 X-RAY (DRAPES) IMPLANT
DRAPE C-ARMOR (DRAPES) ×5 IMPLANT
DRAPE U-SHAPE 47X51 STRL (DRAPES) ×5 IMPLANT
DRSG ADAPTIC 3X8 NADH LF (GAUZE/BANDAGES/DRESSINGS) ×5 IMPLANT
ELECT REM PT RETURN 9FT ADLT (ELECTROSURGICAL) ×5
ELECTRODE REM PT RTRN 9FT ADLT (ELECTROSURGICAL) ×3 IMPLANT
EVACUATOR 1/8 PVC DRAIN (DRAIN) IMPLANT
GAUZE SPONGE 4X4 12PLY STRL (GAUZE/BANDAGES/DRESSINGS) ×5 IMPLANT
GAUZE XEROFORM 5X9 LF (GAUZE/BANDAGES/DRESSINGS) ×5 IMPLANT
GLOVE BIO SURGEON STRL SZ7.5 (GLOVE) ×5 IMPLANT
GLOVE BIO SURGEON STRL SZ8 (GLOVE) ×5 IMPLANT
GLOVE BIOGEL PI IND STRL 7.5 (GLOVE) ×3 IMPLANT
GLOVE BIOGEL PI IND STRL 8 (GLOVE) ×3 IMPLANT
GLOVE BIOGEL PI INDICATOR 7.5 (GLOVE) ×2
GLOVE BIOGEL PI INDICATOR 8 (GLOVE) ×2
GOWN STRL REUS W/ TWL LRG LVL3 (GOWN DISPOSABLE) ×6 IMPLANT
GOWN STRL REUS W/ TWL XL LVL3 (GOWN DISPOSABLE) ×3 IMPLANT
GOWN STRL REUS W/TWL LRG LVL3 (GOWN DISPOSABLE) ×4
GOWN STRL REUS W/TWL XL LVL3 (GOWN DISPOSABLE) ×2
HANDPIECE INTERPULSE COAX TIP (DISPOSABLE)
KIT BASIN OR (CUSTOM PROCEDURE TRAY) ×5 IMPLANT
KIT ROOM TURNOVER OR (KITS) ×5 IMPLANT
MANIFOLD NEPTUNE II (INSTRUMENTS) ×5 IMPLANT
NEEDLE 22X1 1/2 (OR ONLY) (NEEDLE) IMPLANT
NS IRRIG 1000ML POUR BTL (IV SOLUTION) ×5 IMPLANT
PACK ORTHO EXTREMITY (CUSTOM PROCEDURE TRAY) ×5 IMPLANT
PAD ARMBOARD 7.5X6 YLW CONV (MISCELLANEOUS) ×10 IMPLANT
PADDING CAST COTTON 6X4 STRL (CAST SUPPLIES) ×15 IMPLANT
PIN APEX 180X50X5XST SLF (PIN) ×6 IMPLANT
PIN APEX 5X150 (EXFIX) ×10 IMPLANT
PIN APEX 5X180 (PIN) ×4
PIN CLAMP 5H 30DEG POST (EXFIX) ×10 IMPLANT
POST 30DEG ANGLED DIA 11M (EXFIX) ×10 IMPLANT
ROD CARBON (EXFIX) ×4
ROD CNCT 400X11XNS LF HFMN (EXFIX) ×6 IMPLANT
ROD TO ROD COUPLING EXFIX (EXFIX) ×20 IMPLANT
SET CYSTO W/LG BORE CLAMP LF (SET/KITS/TRAYS/PACK) ×5 IMPLANT
SET HNDPC FAN SPRY TIP SCT (DISPOSABLE) IMPLANT
SPONGE LAP 18X18 X RAY DECT (DISPOSABLE) ×5 IMPLANT
SPONGE SCRUB IODOPHOR (GAUZE/BANDAGES/DRESSINGS) ×5 IMPLANT
STAPLER VISISTAT 35W (STAPLE) IMPLANT
STOCKINETTE IMPERVIOUS LG (DRAPES) ×5 IMPLANT
STRIP CLOSURE SKIN 1/2X4 (GAUZE/BANDAGES/DRESSINGS) IMPLANT
SUCTION FRAZIER HANDLE 10FR (MISCELLANEOUS)
SUCTION TUBE FRAZIER 10FR DISP (MISCELLANEOUS) IMPLANT
SUT ETHILON 3 0 PS 1 (SUTURE) ×5 IMPLANT
SUT VIC AB 0 CT1 27 (SUTURE) ×4
SUT VIC AB 0 CT1 27XBRD ANBCTR (SUTURE) ×6 IMPLANT
SUT VIC AB 2-0 CT1 27 (SUTURE) ×4
SUT VIC AB 2-0 CT1 TAPERPNT 27 (SUTURE) ×6 IMPLANT
SYR CONTROL 10ML LL (SYRINGE) IMPLANT
TOWEL OR 17X24 6PK STRL BLUE (TOWEL DISPOSABLE) ×10 IMPLANT
TOWEL OR 17X26 10 PK STRL BLUE (TOWEL DISPOSABLE) ×10 IMPLANT
TUBE CONNECTING 12'X1/4 (SUCTIONS) ×1
TUBE CONNECTING 12X1/4 (SUCTIONS) ×4 IMPLANT
UNDERPAD 30X30 INCONTINENT (UNDERPADS AND DIAPERS) ×5 IMPLANT
WATER STERILE IRR 1000ML POUR (IV SOLUTION) ×10 IMPLANT
YANKAUER SUCT BULB TIP NO VENT (SUCTIONS) ×5 IMPLANT

## 2015-12-17 NOTE — ED Notes (Signed)
Pt unable to tolerate lying on Xray table.  RN to Xray for PRN Fentanyl administration.

## 2015-12-17 NOTE — ED Notes (Signed)
20 dollars, cellphone given to security. Belonging bags send up with patient.
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
## 2015-12-17 NOTE — Progress Notes (Addendum)
Orthopedic Tech Progress Note Patient Details:  Mark Chambers 1976-01-15 [id number]  Musculoskeletal Traction Type of Traction: Skeletal (Balanced Suspension) Traction Location: applied frame to bed in or. left 10lbs. Traction Weight: 10 lbs Verbal order per dr Rochele Raring, Mosie Epstein 12/17/2015, 9:27 PM

## 2015-12-17 NOTE — Op Note (Signed)
12/17/2015  9:51 PM  PATIENT:  Mark Chambers    PRE-OPERATIVE DIAGNOSIS:  RIGHT HIP DISLOCATION, TIBIAL PLATEAU FRACTURE  POST-OPERATIVE DIAGNOSIS:  Same  PROCEDURE:  CLOSED REDUCTION HIP, IRRIGATION AND DEBRIDEMENT AND CLOSURE RIGHT KNEE LACERATION, EXTERNAL FIXATION RIGHT LEG  SURGEON:  MURPHY, TIMOTHY D, MD  ASSISTANT: Janace Litten, OPA-C, present and scrubbed throughout the case, critical for completion in a timely fashion, and for retraction, instrumentation, and closure.   ANESTHESIA:   gen  PREOPERATIVE INDICATIONS:  Mark Chambers is a  40 y.o. male with a diagnosis of RIGHT HIP DISLOCATION, TIBIAL PLATEAU FRACTURE who failed conservative measures and elected for surgical management.    The risks benefits and alternatives were discussed with the patient preoperatively including but not limited to the risks of infection, bleeding, nerve injury, cardiopulmonary complications, the need for revision surgery, among others, and the patient was willing to proceed.  OPERATIVE IMPLANTS: [employer] hoffman ex fix  OPERATIVE FINDINGS: unstable hip fracture dislocation, bycondylar tibial plateau fracture  BLOOD LOSS: min  COMPLICATIONS: none  TOURNIQUET TIME: none  OPERATIVE PROCEDURE:  Patient was identified in the preoperative holding area and site was marked by me He was transported to the operating theater and placed on the table in supine position taking care to pad all bony prominences. After a preincinduction time out anesthesia was induced. The right lower extremity was prepped and draped in normal sterile fashion and a pre-incision timeout was performed. He received ancef for preoperative antibiotics.   I provide traction and internal rotation to the right leg affecting a closed reduction to the right hip joint. I took x-rays to confirm this. With minimal flexion of the hip and internal rotation it didn't dislocate very easily. I was able to reduce it relatively  easily.  Next I performed a thorough exploration of the transverse laceration. This did not track down to his patella fracture. I performed a thorough irrigation with 3 L of saline. I debrided any nonviable lysed skin muscle or fascia with scissors this is an excisional debridement. I then closed this 5 cm laceration with simple stitches.  Next I examined his knee under fluoroscopic guidance this was a by condylar tibial plateau fracture he was very unstable to hyperextension and valgus stressing at this point I elected to place an external fixator across this fracture. I placed 2 anterior tibial pins and 2 anterior femoral pins keeping them away from the knee joint and away from proposed fracture fixation site. I took 2 x-rays to confirm appropriate placement of these pins. I then assembled a knee spanning frame.  I took multiple x-rays and adjusting the external fixator until there was appropriate reduction of his plateau joint. I then final tightened the external fixator.  I then placed sterile dressings and a Ace wrap.  He was placed in traction and abduction to help hold his hip in the place until such time that he could have a posterior wall fixation.  POST OPERATIVE PLAN: Traction full time, Lovenox.    This note was generated using a template and dragon dictation system. In light of that, I have reviewed the note and all aspects of it are applicable to this case. Any dictation errors are due to the computerized dictation system.

## 2015-12-17 NOTE — Transfer of Care (Signed)
Immediate Anesthesia Transfer of Care Note  Patient: Mark Chambers  Procedure(s) Performed: Procedure(s): CLOSED REDUCTION HIP, IRRIGATION AND DEBRIDEMENT AND CLOSURE RIGHT KNEE LACERATION (Right) EXTERNAL FIXATION RIGHT LEG (Right)   Patient Location: PACU  Anesthesia Type:General  Level of Consciousness: awake, alert  and oriented  Airway & Oxygen Therapy: Patient connected to nasal cannula oxygen  Post-op Assessment: Report given to RN and Post -op Vital signs reviewed and stable  Post vital signs: Reviewed and stable  Last Vitals:  Filed Vitals:   12/17/15 1943 12/17/15 2154  BP: 103/61 120/84  Pulse: 67 106  Temp:  37.3 C  Resp: 14 17    Complications: No apparent anesthesia complications
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
## 2015-12-17 NOTE — ED Notes (Signed)
Cleared by Dr. Anitra Lauth for OR

## 2015-12-17 NOTE — Anesthesia Postprocedure Evaluation (Signed)
Anesthesia Post Note  Patient: Mark Chambers  Procedure(s) Performed: Procedure(s) (LRB): CLOSED REDUCTION HIP, IRRIGATION AND DEBRIDEMENT AND CLOSURE RIGHT KNEE LACERATION (Right) EXTERNAL FIXATION RIGHT LEG (Right)  Patient location during evaluation: PACU Anesthesia Type: General Level of consciousness: awake and alert, awake and sedated Pain management: pain level controlled Vital Signs Assessment: post-procedure vital signs reviewed and stable Respiratory status: spontaneous breathing, nonlabored ventilation, respiratory function stable and patient connected to nasal cannula oxygen Cardiovascular status: blood pressure returned to baseline and stable Postop Assessment: no signs of nausea or vomiting Anesthetic complications: no    Last Vitals:  Filed Vitals:   12/17/15 2200 12/17/15 2215  BP: 120/84 121/81  Pulse: 90 89  Temp:    Resp: 13 13    Last Pain:  Filed Vitals:   12/17/15 2225  PainSc: 5                  Saed Hudlow,JAMES TERRILL
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
## 2015-12-18 ENCOUNTER — Encounter (HOSPITAL_COMMUNITY): Payer: Self-pay | Admitting: General Practice

## 2015-12-18 ENCOUNTER — Inpatient Hospital Stay (HOSPITAL_COMMUNITY): Payer: Medicare Other

## 2015-12-18 DIAGNOSIS — D72829 Elevated white blood cell count, unspecified: Secondary | ICD-10-CM | POA: Diagnosis present

## 2015-12-18 DIAGNOSIS — S069X9A Unspecified intracranial injury with loss of consciousness of unspecified duration, initial encounter: Secondary | ICD-10-CM | POA: Insufficient documentation

## 2015-12-18 DIAGNOSIS — S069XAA Unspecified intracranial injury with loss of consciousness status unknown, initial encounter: Secondary | ICD-10-CM | POA: Insufficient documentation

## 2015-12-18 DIAGNOSIS — J4520 Mild intermittent asthma, uncomplicated: Secondary | ICD-10-CM

## 2015-12-18 DIAGNOSIS — J45909 Unspecified asthma, uncomplicated: Secondary | ICD-10-CM | POA: Insufficient documentation

## 2015-12-18 DIAGNOSIS — Z8669 Personal history of other diseases of the nervous system and sense organs: Secondary | ICD-10-CM

## 2015-12-18 DIAGNOSIS — Z87898 Personal history of other specified conditions: Secondary | ICD-10-CM | POA: Insufficient documentation

## 2015-12-18 LAB — CBC
HCT: 40 % (ref 39.0–52.0)
Hemoglobin: 13 g/dL (ref 13.0–17.0)
MCH: 29.1 pg (ref 26.0–34.0)
MCHC: 32.5 g/dL (ref 30.0–36.0)
MCV: 89.7 fL (ref 78.0–100.0)
PLATELETS: 161 10*3/uL (ref 150–400)
RBC: 4.46 MIL/uL (ref 4.22–5.81)
RDW: 13.5 % (ref 11.5–15.5)
WBC: 17.6 10*3/uL — ABNORMAL HIGH (ref 4.0–10.5)

## 2015-12-18 LAB — CREATININE, SERUM
CREATININE: 0.95 mg/dL (ref 0.61–1.24)
GFR calc Af Amer: >60 mL/min (ref >60)
GFR calc non Af Amer: >60 mL/min (ref >60)

## 2015-12-18 MED ORDER — OXYCODONE HCL 5 MG PO TABS
5.0000 mg | ORAL_TABLET | ORAL | Status: DC | PRN
  Administered 2015-12-18: 10 mg via ORAL
  Administered 2015-12-18 – 2015-12-21 (×7): 15 mg via ORAL
  Administered 2015-12-21 – 2015-12-22 (×3): 10 mg via ORAL
  Administered 2015-12-23: 5 mg via ORAL
  Administered 2015-12-23 (×2): 10 mg via ORAL
  Administered 2015-12-24 – 2015-12-25 (×3): 15 mg via ORAL
  Filled 2015-12-18: qty 3
  Filled 2015-12-18: qty 2
  Filled 2015-12-18 (×2): qty 3
  Filled 2015-12-18 (×2): qty 2
  Filled 2015-12-18: qty 3
  Filled 2015-12-18: qty 2
  Filled 2015-12-18: qty 1
  Filled 2015-12-18: qty 2
  Filled 2015-12-18 (×3): qty 3
  Filled 2015-12-18: qty 2
  Filled 2015-12-18 (×4): qty 3

## 2015-12-18 MED ORDER — HYDROMORPHONE HCL 1 MG/ML IJ SOLN: 0.5000 mg | INTRAMUSCULAR | Status: DC | PRN

## 2015-12-18 MED ORDER — ENSURE ENLIVE PO LIQD
237.0000 mL | Freq: Two times a day (BID) | ORAL | Status: DC
  Administered 2015-12-18 – 2015-12-25 (×12): 237 mL via ORAL

## 2015-12-18 MED ORDER — LEVETIRACETAM 500 MG PO TABS
500.0000 mg | ORAL_TABLET | Freq: Once | ORAL | Status: AC
  Administered 2015-12-18: 500 mg via ORAL
  Filled 2015-12-18: qty 1

## 2015-12-18 MED ORDER — LEVETIRACETAM ER 500 MG PO TB24
1000.0000 mg | ORAL_TABLET | Freq: Every day | ORAL | Status: DC
  Administered 2015-12-18 – 2015-12-25 (×8): 1000 mg via ORAL
  Filled 2015-12-18 (×8): qty 2

## 2015-12-18 MED ORDER — POLYETHYLENE GLYCOL 3350 17 G PO PACK
17.0000 g | PACK | Freq: Every day | ORAL | Status: DC
  Administered 2015-12-20 – 2015-12-25 (×6): 17 g via ORAL
  Filled 2015-12-18 (×7): qty 1

## 2015-12-18 MED ORDER — TRAZODONE HCL 100 MG PO TABS
100.0000 mg | ORAL_TABLET | Freq: Every day | ORAL | Status: DC
  Administered 2015-12-18 – 2015-12-24 (×7): 100 mg via ORAL
  Filled 2015-12-18 (×8): qty 1

## 2015-12-18 NOTE — Progress Notes (Signed)
Orthopedic Tech Progress Note Patient Details:  Mark Chambers 22-Jun-1976 [id number]  Patient ID: Mark Chambers, male   DOB: [birth year], 40 y.o.   MRN: [id number] Had to go with pt. to remove and replace weight on skeletal traction.  Trinna Post 12/18/2015, 7:01 AM

## 2015-12-18 NOTE — Progress Notes (Signed)
OT Cancellation Note  Patient Details Name: Mark Chambers MRN: [id number] DOB: 10/06/1976   Cancelled Treatment:    Reason Eval/Treat Not Completed: Patient not medically ready Pt in traction awaiting Dr. Carola Frost to consult. Will assess when medically appropriate.  [facility] [name], OTR/L  [phone] 12/18/2015 12/18/2015, 3:16 PM
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
## 2015-12-19 ENCOUNTER — Inpatient Hospital Stay (HOSPITAL_COMMUNITY): Payer: Medicare Other | Admitting: Anesthesiology

## 2015-12-19 ENCOUNTER — Inpatient Hospital Stay (HOSPITAL_COMMUNITY): Payer: Medicare Other

## 2015-12-19 ENCOUNTER — Encounter (HOSPITAL_COMMUNITY): Payer: Self-pay | Admitting: Anesthesiology

## 2015-12-19 ENCOUNTER — Encounter (HOSPITAL_COMMUNITY): Admission: EM | Disposition: A | Discharge status: Skilled Nursing Facility | Payer: Self-pay | Source: Home / Self Care

## 2015-12-19 HISTORY — PX: ORIF ACETABULAR FRACTURE: SHX5029

## 2015-12-19 HISTORY — PX: ORIF TIBIA PLATEAU: SHX2132

## 2015-12-19 HISTORY — PX: EXTERNAL FIXATION REMOVAL: SHX5040

## 2015-12-19 LAB — PREPARE RBC (CROSSMATCH)

## 2015-12-19 LAB — ABO/RH: ABO/RH(D): A POS

## 2015-12-19 LAB — SURGICAL PCR SCREEN
MRSA, PCR: NEGATIVE
Staphylococcus aureus: NEGATIVE

## 2015-12-19 SURGERY — OPEN REDUCTION INTERNAL FIXATION (ORIF) ACETABULAR FRACTURE
Anesthesia: General | Laterality: Right

## 2015-12-19 MED ORDER — SODIUM CHLORIDE 0.9 % IV SOLN
Freq: Once | INTRAVENOUS | Status: AC
  Administered 2015-12-21: via INTRAVENOUS

## 2015-12-19 MED ORDER — FENTANYL CITRATE (PF) 250 MCG/5ML IJ SOLN
INTRAMUSCULAR | Status: AC
  Filled 2015-12-19: qty 5

## 2015-12-19 MED ORDER — ESMOLOL HCL 100 MG/10ML IV SOLN
INTRAVENOUS | Status: AC
  Filled 2015-12-19: qty 10

## 2015-12-19 MED ORDER — ESMOLOL HCL 100 MG/10ML IV SOLN
INTRAVENOUS | Status: DC | PRN
  Administered 2015-12-19: 30 mg via INTRAVENOUS

## 2015-12-19 MED ORDER — ROCURONIUM BROMIDE 100 MG/10ML IV SOLN
INTRAVENOUS | Status: DC | PRN
  Administered 2015-12-19: 50 mg via INTRAVENOUS

## 2015-12-19 MED ORDER — OXYCODONE HCL 5 MG/5ML PO SOLN: 5.0000 mg | Freq: Once | ORAL | Status: DC | PRN

## 2015-12-19 MED ORDER — STERILE WATER FOR INJECTION IJ SOLN
INTRAMUSCULAR | Status: AC
  Filled 2015-12-19: qty 10

## 2015-12-19 MED ORDER — ROCURONIUM BROMIDE 50 MG/5ML IV SOLN
INTRAVENOUS | Status: AC
  Filled 2015-12-19: qty 1

## 2015-12-19 MED ORDER — SUGAMMADEX SODIUM 200 MG/2ML IV SOLN
INTRAVENOUS | Status: DC | PRN
  Administered 2015-12-19: 200 mg via INTRAVENOUS

## 2015-12-19 MED ORDER — STERILE WATER FOR INJECTION IJ SOLN
INTRAMUSCULAR | Status: AC
  Filled 2015-12-19: qty 20

## 2015-12-19 MED ORDER — VECURONIUM BROMIDE 10 MG IV SOLR
INTRAVENOUS | Status: AC
  Filled 2015-12-19: qty 20

## 2015-12-19 MED ORDER — SUGAMMADEX SODIUM 200 MG/2ML IV SOLN
INTRAVENOUS | Status: AC
  Filled 2015-12-19: qty 2

## 2015-12-19 MED ORDER — PHENYLEPHRINE HCL 10 MG/ML IJ SOLN
INTRAMUSCULAR | Status: DC | PRN
  Administered 2015-12-19 (×10): 80 ug via INTRAVENOUS

## 2015-12-19 MED ORDER — ACETAMINOPHEN 325 MG PO TABS: 325.0000 mg | ORAL_TABLET | ORAL | Status: DC | PRN

## 2015-12-19 MED ORDER — OXYCODONE-ACETAMINOPHEN 5-325 MG PO TABS
1.0000 | ORAL_TABLET | Freq: Four times a day (QID) | ORAL | Status: DC | PRN
  Administered 2015-12-20 (×3): 2 via ORAL
  Filled 2015-12-19 (×3): qty 2

## 2015-12-19 MED ORDER — OXYCODONE HCL 5 MG PO TABS: 5.0000 mg | ORAL_TABLET | Freq: Once | ORAL | Status: DC | PRN

## 2015-12-19 MED ORDER — FENTANYL CITRATE (PF) 100 MCG/2ML IJ SOLN
INTRAMUSCULAR | Status: DC | PRN
  Administered 2015-12-19 (×5): 50 ug via INTRAVENOUS
  Administered 2015-12-19: 100 ug via INTRAVENOUS
  Administered 2015-12-19: 50 ug via INTRAVENOUS
  Administered 2015-12-19: 100 ug via INTRAVENOUS

## 2015-12-19 MED ORDER — PROPOFOL 10 MG/ML IV BOLUS
INTRAVENOUS | Status: AC
  Filled 2015-12-19: qty 40

## 2015-12-19 MED ORDER — HYDROMORPHONE HCL 1 MG/ML IJ SOLN
0.2500 mg | INTRAMUSCULAR | Status: DC | PRN
  Administered 2015-12-19 (×4): 0.5 mg via INTRAVENOUS

## 2015-12-19 MED ORDER — CEFAZOLIN SODIUM-DEXTROSE 2-3 GM-% IV SOLR
2.0000 g | Freq: Three times a day (TID) | INTRAVENOUS | Status: AC
  Administered 2015-12-19 – 2015-12-20 (×3): 2 g via INTRAVENOUS
  Filled 2015-12-19 (×3): qty 50

## 2015-12-19 MED ORDER — CEFAZOLIN SODIUM-DEXTROSE 2-3 GM-% IV SOLR
INTRAVENOUS | Status: AC
  Administered 2015-12-19 (×2): 2 g via INTRAVENOUS
  Filled 2015-12-19: qty 50

## 2015-12-19 MED ORDER — LIDOCAINE HCL (CARDIAC) 20 MG/ML IV SOLN
INTRAVENOUS | Status: AC
  Filled 2015-12-19: qty 5

## 2015-12-19 MED ORDER — ARTIFICIAL TEARS OP OINT
TOPICAL_OINTMENT | OPHTHALMIC | Status: DC | PRN
  Administered 2015-12-19: 1 via OPHTHALMIC

## 2015-12-19 MED ORDER — ALBUTEROL SULFATE (2.5 MG/3ML) 0.083% IN NEBU: 2.5000 mg | INHALATION_SOLUTION | RESPIRATORY_TRACT | Status: DC | PRN

## 2015-12-19 MED ORDER — DEXTROSE 5 % IV SOLN
10.0000 mg | INTRAVENOUS | Status: DC | PRN
  Administered 2015-12-19: 30 ug/min via INTRAVENOUS

## 2015-12-19 MED ORDER — MIDAZOLAM HCL 5 MG/5ML IJ SOLN
INTRAMUSCULAR | Status: DC | PRN
  Administered 2015-12-19: 2 mg via INTRAVENOUS

## 2015-12-19 MED ORDER — HYDROMORPHONE HCL 1 MG/ML IJ SOLN
INTRAMUSCULAR | Status: AC
  Filled 2015-12-19: qty 1

## 2015-12-19 MED ORDER — LACTATED RINGERS IV SOLN
INTRAVENOUS | Status: DC
  Administered 2015-12-19 (×3): via INTRAVENOUS

## 2015-12-19 MED ORDER — VECURONIUM BROMIDE 10 MG IV SOLR
INTRAVENOUS | Status: AC
  Filled 2015-12-19: qty 10

## 2015-12-19 MED ORDER — VECURONIUM BROMIDE 10 MG IV SOLR
INTRAVENOUS | Status: DC | PRN
  Administered 2015-12-19: 3 mg via INTRAVENOUS
  Administered 2015-12-19 (×2): 2 mg via INTRAVENOUS
  Administered 2015-12-19: 5 mg via INTRAVENOUS
  Administered 2015-12-19: 2 mg via INTRAVENOUS
  Administered 2015-12-19: 3 mg via INTRAVENOUS
  Administered 2015-12-19: 5 mg via INTRAVENOUS

## 2015-12-19 MED ORDER — ACETAMINOPHEN 160 MG/5ML PO SOLN: 325.0000 mg | ORAL | Status: DC | PRN

## 2015-12-19 MED ORDER — ALBUMIN HUMAN 5 % IV SOLN
INTRAVENOUS | Status: DC | PRN
  Administered 2015-12-19: 15:00:00 via INTRAVENOUS

## 2015-12-19 MED ORDER — ONDANSETRON HCL 4 MG/2ML IJ SOLN
INTRAMUSCULAR | Status: AC
  Filled 2015-12-19: qty 2

## 2015-12-19 MED ORDER — HYDROMORPHONE HCL 1 MG/ML IJ SOLN: 0.5000 mg | INTRAMUSCULAR | Status: DC | PRN

## 2015-12-19 MED ORDER — LIDOCAINE HCL (CARDIAC) 20 MG/ML IV SOLN
INTRAVENOUS | Status: DC | PRN
  Administered 2015-12-19: 100 mg via INTRAVENOUS

## 2015-12-19 MED ORDER — LACTATED RINGERS IV SOLN
INTRAVENOUS | Status: DC | PRN
  Administered 2015-12-19: 14:00:00 via INTRAVENOUS

## 2015-12-19 MED ORDER — ONDANSETRON HCL 4 MG/2ML IJ SOLN
INTRAMUSCULAR | Status: DC | PRN
  Administered 2015-12-19: 4 mg via INTRAVENOUS

## 2015-12-19 MED ORDER — PROPOFOL 10 MG/ML IV BOLUS
INTRAVENOUS | Status: DC | PRN
  Administered 2015-12-19: 100 mg via INTRAVENOUS

## 2015-12-19 MED ORDER — 0.9 % SODIUM CHLORIDE (POUR BTL) OPTIME
TOPICAL | Status: DC | PRN
  Administered 2015-12-19 (×2): 1000 mL

## 2015-12-19 MED ORDER — MIDAZOLAM HCL 2 MG/2ML IJ SOLN
INTRAMUSCULAR | Status: AC
  Filled 2015-12-19: qty 2

## 2015-12-19 SURGICAL SUPPLY — 110 items
APPLIER CLIP 11 MED OPEN (CLIP)
BIT DRILL AO MATTA 2.5MX230M (BIT) ×1 IMPLANT
BIT DRILL TWST MATTA 3.5MX195M (BIT) ×1 IMPLANT
BLADE SURG ROTATE 9660 (MISCELLANEOUS) IMPLANT
BNDG COHESIVE 4X5 TAN STRL (GAUZE/BANDAGES/DRESSINGS) ×3 IMPLANT
BNDG COHESIVE 6X5 TAN STRL LF (GAUZE/BANDAGES/DRESSINGS) ×3 IMPLANT
BNDG GAUZE ELAST 4 BULKY (GAUZE/BANDAGES/DRESSINGS) ×3 IMPLANT
BONE CANC CHIPS 20CC PCAN1/4 (Bone Implant) ×3 IMPLANT
BRUSH SCRUB DISP (MISCELLANEOUS) ×6 IMPLANT
CHIPS CANC BONE 20CC PCAN1/4 (Bone Implant) ×1 IMPLANT
CLIP APPLIE 11 MED OPEN (CLIP) IMPLANT
CLOSURE WOUND 1/2 X4 (GAUZE/BANDAGES/DRESSINGS) ×1
COVER MAYO STAND STRL (DRAPES) ×3 IMPLANT
COVER SURGICAL LIGHT HANDLE (MISCELLANEOUS) ×6 IMPLANT
DRAIN CHANNEL 10F 3/8 F FF (DRAIN) IMPLANT
DRAIN CHANNEL 15F RND FF W/TCR (WOUND CARE) IMPLANT
DRAPE C-ARM 42X72 X-RAY (DRAPES) ×3 IMPLANT
DRAPE C-ARMOR (DRAPES) ×6 IMPLANT
DRAPE IMP U-DRAPE 54X76 (DRAPES) ×3 IMPLANT
DRAPE INCISE IOBAN 66X45 STRL (DRAPES) ×3 IMPLANT
DRAPE INCISE IOBAN 85X60 (DRAPES) ×6 IMPLANT
DRAPE ORTHO SPLIT 77X108 STRL (DRAPES) ×8
DRAPE PROXIMA HALF (DRAPES) ×9 IMPLANT
DRAPE SURG ORHT 6 SPLT 77X108 (DRAPES) ×4 IMPLANT
DRAPE U-SHAPE 47X51 STRL (DRAPES) ×3 IMPLANT
DRILL BIT 2.5X100 214235007 (MISCELLANEOUS) ×3 IMPLANT
DRILL BIT AO MATTA 2.5MX230M (BIT) ×3
DRILL TWIST AO MATTA 3.5MX195M (BIT) ×3
DRSG MEPILEX BORDER 4X12 (GAUZE/BANDAGES/DRESSINGS) ×3 IMPLANT
DRSG MEPILEX BORDER 4X8 (GAUZE/BANDAGES/DRESSINGS) IMPLANT
ELECT BLADE 6.5 EXT (BLADE) ×3 IMPLANT
ELECT REM PT RETURN 9FT ADLT (ELECTROSURGICAL) ×3
ELECTRODE REM PT RTRN 9FT ADLT (ELECTROSURGICAL) ×1 IMPLANT
EVACUATOR 1/8 PVC DRAIN (DRAIN) IMPLANT
EVACUATOR SILICONE 100CC (DRAIN) ×3 IMPLANT
GAUZE SPONGE 4X4 12PLY STRL (GAUZE/BANDAGES/DRESSINGS) ×3 IMPLANT
GAUZE SPONGE 4X4 16PLY XRAY LF (GAUZE/BANDAGES/DRESSINGS) ×3 IMPLANT
GLOVE BIO SURGEON STRL SZ7.5 (GLOVE) ×3 IMPLANT
GLOVE BIO SURGEON STRL SZ8 (GLOVE) ×3 IMPLANT
GLOVE BIOGEL PI IND STRL 6.5 (GLOVE) ×1 IMPLANT
GLOVE BIOGEL PI IND STRL 7.5 (GLOVE) ×1 IMPLANT
GLOVE BIOGEL PI IND STRL 8 (GLOVE) ×1 IMPLANT
GLOVE BIOGEL PI INDICATOR 6.5 (GLOVE) ×2
GLOVE BIOGEL PI INDICATOR 7.5 (GLOVE) ×2
GLOVE BIOGEL PI INDICATOR 8 (GLOVE) ×2
GOWN STRL REUS W/ TWL LRG LVL3 (GOWN DISPOSABLE) ×2 IMPLANT
GOWN STRL REUS W/ TWL XL LVL3 (GOWN DISPOSABLE) ×1 IMPLANT
GOWN STRL REUS W/TWL 2XL LVL3 (GOWN DISPOSABLE) ×3 IMPLANT
GOWN STRL REUS W/TWL LRG LVL3 (GOWN DISPOSABLE) ×4
GOWN STRL REUS W/TWL XL LVL3 (GOWN DISPOSABLE) ×2
HANDPIECE INTERPULSE COAX TIP (DISPOSABLE) ×2
KIT BASIN OR (CUSTOM PROCEDURE TRAY) ×3 IMPLANT
KIT ROOM TURNOVER OR (KITS) ×3 IMPLANT
LIGHT ORTHO (MISCELLANEOUS) ×3 IMPLANT
LOOP VESSEL MAXI BLUE (MISCELLANEOUS) IMPLANT
MANIFOLD NEPTUNE II (INSTRUMENTS) ×3 IMPLANT
NEEDLE MAYO TROCAR (NEEDLE) ×3 IMPLANT
NS IRRIG 1000ML POUR BTL (IV SOLUTION) ×3 IMPLANT
PACK TOTAL JOINT (CUSTOM PROCEDURE TRAY) ×3 IMPLANT
PACK UNIVERSAL I (CUSTOM PROCEDURE TRAY) ×3 IMPLANT
PAD ARMBOARD 7.5X6 YLW CONV (MISCELLANEOUS) ×6 IMPLANT
PENCIL BUTTON HOLSTER BLD 10FT (ELECTRODE) ×3 IMPLANT
PIN APEX 6X180MM EXFIX (EXFIX) ×3 IMPLANT
PLATE ACET STRT 94.5M 8H (Plate) ×3 IMPLANT
PLATE LOCK LG 5H RT PROX TIB (Plate) ×3 IMPLANT
RETRIEVER SUT HEWSON (MISCELLANEOUS) ×3 IMPLANT
SCREW CORT 3.5X40 815037040 (Screw) ×3 IMPLANT
SCREW CORT 3.5X44 815037044 (Screw) ×3 IMPLANT
SCREW CORTEX ST MATTA 3.5X34MM (Screw) ×3 IMPLANT
SCREW CORTEX ST MATTA 3.5X36MM (Screw) ×3 IMPLANT
SCREW CORTEX ST MATTA 3.5X38M (Screw) ×3 IMPLANT
SCREW CORTEX ST MATTA 3.5X40MM (Screw) ×3 IMPLANT
SCREW CORTEX ST MATTA 3.5X45MM (Screw) ×3 IMPLANT
SCREW CORTICAL 3.5MM 48MM (Screw) ×3 IMPLANT
SCREW CORTICAL 3.5MM 50MM (Screw) ×3 IMPLANT
SCREW CORTICAL 3.5X42MM (Screw) ×3 IMPLANT
SCREW LOCK CORT STAR 3.5X70 (Screw) ×6 IMPLANT
SCREW LOCK CORT STAR 3.5X75 (Screw) ×3 IMPLANT
SCREW LOCK CORT STAR 3.5X80 (Screw) ×9 IMPLANT
SCREW LOCK CORT STAR 3.5X85 (Screw) ×3 IMPLANT
SCREW LP 3.5X90MM (Screw) ×3 IMPLANT
SET HNDPC FAN SPRY TIP SCT (DISPOSABLE) ×1 IMPLANT
SLEEVE SURGEON STRL (DRAPES) ×3 IMPLANT
SPONGE LAP 18X18 X RAY DECT (DISPOSABLE) ×9 IMPLANT
STAPLER VISISTAT 35W (STAPLE) ×6 IMPLANT
STOCKINETTE IMPERVIOUS LG (DRAPES) ×3 IMPLANT
STRIP CLOSURE SKIN 1/2X4 (GAUZE/BANDAGES/DRESSINGS) ×2 IMPLANT
SUCTION FRAZIER HANDLE 10FR (MISCELLANEOUS) ×2
SUCTION FRAZIER TIP 10 FR DISP (SUCTIONS) ×3 IMPLANT
SUCTION TUBE FRAZIER 10FR DISP (MISCELLANEOUS) ×1 IMPLANT
SUT ETHILON 3 0 PS 1 (SUTURE) ×12 IMPLANT
SUT FIBERWIRE #2 38 T-5 BLUE (SUTURE)
SUT PDS AB 2-0 CT1 27 (SUTURE) ×3 IMPLANT
SUT VIC AB 0 CT1 27 (SUTURE) ×6
SUT VIC AB 0 CT1 27XBRD ANBCTR (SUTURE) ×3 IMPLANT
SUT VIC AB 1 CT1 18XCR BRD 8 (SUTURE) ×1 IMPLANT
SUT VIC AB 1 CT1 27 (SUTURE) ×14
SUT VIC AB 1 CT1 27XBRD ANBCTR (SUTURE) ×7 IMPLANT
SUT VIC AB 1 CT1 8-18 (SUTURE) ×2
SUT VIC AB 2-0 CT1 27 (SUTURE) ×6
SUT VIC AB 2-0 CT1 36 (SUTURE) ×9 IMPLANT
SUT VIC AB 2-0 CT1 TAPERPNT 27 (SUTURE) ×3 IMPLANT
SUTURE FIBERWR #2 38 T-5 BLUE (SUTURE) IMPLANT
TOWEL OR 17X24 6PK STRL BLUE (TOWEL DISPOSABLE) ×12 IMPLANT
TOWEL OR 17X26 10 PK STRL BLUE (TOWEL DISPOSABLE) ×9 IMPLANT
TRAY FOLEY CATH 16FRSI W/METER (SET/KITS/TRAYS/PACK) IMPLANT
TUBE CONNECTING 12'X1/4 (SUCTIONS) ×2
TUBE CONNECTING 12X1/4 (SUCTIONS) ×4 IMPLANT
WATER STERILE IRR 1000ML POUR (IV SOLUTION) IMPLANT
WIRE K 1.6MM 144256 (MISCELLANEOUS) ×3 IMPLANT

## 2015-12-19 NOTE — Transfer of Care (Signed)
Immediate Anesthesia Transfer of Care Note  Patient: Laythan Hayter  Procedure(s) Performed: Procedure(s): OPEN REDUCTION INTERNAL FIXATION (ORIF) ACETABULAR FRACTURE (Right) OPEN REDUCTION INTERNAL FIXATION (ORIF) TIBIAL PLATEAU (Right) REMOVAL EXTERNAL FIXATION LEG (Right)  Patient Location: PACU  Anesthesia Type:General  Level of Consciousness: awake, alert , oriented and patient cooperative  Airway & Oxygen Therapy: Patient Spontanous Breathing and Patient connected to nasal cannula oxygen  Post-op Assessment: Report given to RN and Post -op Vital signs reviewed and stable  Post vital signs: Reviewed and stable  Last Vitals:  Filed Vitals:   12/19/15 0550 12/19/15 1114  BP: 139/102 131/86  Pulse: 115 106  Temp: 36.9 C 37.1 C  Resp: 16 16    Complications: No apparent anesthesia complications

## 2015-12-19 NOTE — Anesthesia Postprocedure Evaluation (Signed)
Anesthesia Post Note  Patient: Nicolaos Mitrano  Procedure(s) Performed: Procedure(s) (LRB): OPEN REDUCTION INTERNAL FIXATION (ORIF) ACETABULAR FRACTURE (Right) OPEN REDUCTION INTERNAL FIXATION (ORIF) TIBIAL PLATEAU (Right) REMOVAL EXTERNAL FIXATION LEG (Right)  Patient location during evaluation: PACU Anesthesia Type: General Level of consciousness: sedated Pain management: pain level controlled Vital Signs Assessment: post-procedure vital signs reviewed and stable Respiratory status: spontaneous breathing and respiratory function stable Cardiovascular status: stable Anesthetic complications: no    Last Vitals:  Filed Vitals:   12/19/15 1845 12/19/15 1900  BP: 153/89 146/89  Pulse: 104 110  Temp:    Resp: 14 16    Last Pain:  Filed Vitals:   12/19/15 1907  PainSc: Asleep                 Sameen Leas DANIEL

## 2015-12-19 NOTE — Brief Op Note (Addendum)
12/17/2015 - 12/19/2015  6:22 PM  PATIENT:  Mark Chambers  40 y.o. male  PRE-OPERATIVE DIAGNOSIS:  1. TRANSVERSE POSTERIOR WALL ACETABULAR FRACTURE, RIGHT, WITH MARGINAL IMPACTION 2. BICONDYLAR TIBIAL PLATEAU FRACTURE, RIGHT  POST-OPERATIVE DIAGNOSIS:  1. COMMINUTED POSTERIOR WALL ACETABULAR FRACTURE, RIGHT, WITH MARGINAL IMPACTION 2. BICONDYLAR TIBIAL PLATEAU FRACTURE, RIGHT 3. LATERAL MENISCUS TEAR, MIDBODY  PROCEDURE:  Procedure(s): 1. OPEN REDUCTION INTERNAL FIXATION (ORIF) ACETABULAR FRACTURE (Right), COMMINUTED POSTERIOR WALL WITH MARGINAL IMPACTION 2. OPEN REDUCTION INTERNAL FIXATION (ORIF) TIBIAL PLATEAU (Right), BICONDYLAR 3. PARTIAL LATERAL MENISCECTOMY 4. REMOVAL EXTERNAL FIXATION LEG (Right) 5. RIGHT LEG ANTERIOR COMPARTMENT FASCIOTOMY  SURGEON:  Surgeon(s) and Role:    * Myrene Galas, MD - Primary  PHYSICIAN ASSISTANT: Patrick Jupiter RNFA  ANESTHESIA:   general  I/O:  Total I/O In: 3450 [I.V.:3200; IV Piggyback:250] Out: 1310 [Urine:910; Blood:400]  SPECIMEN:  No Specimen  TOURNIQUET:  * No tourniquets in log *  DICTATION: .Other Dictation: Dictation Number [id number]

## 2015-12-19 NOTE — Care Management Important Message (Signed)
Important Message  Patient Details  Name: Mark Chambers MRN: [id number] Date of Birth: 1975/11/30   Medicare Important Message Given:  Yes    Berta Denson P Rakiyah Esch 12/19/2015, 11:50 AM
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
## 2015-12-19 NOTE — Progress Notes (Signed)
Patient ID: Mark Chambers, male   DOB: Apr 27, 1976, 40 y.o.   MRN: [id number]   LOS: 2 days   Subjective: No new c/o.   Objective: Vital signs in last 24 hours: Temp:  [98.5 F (36.9 C)-100.6 F (38.1 C)] 98.5 F (36.9 C) (02/17 0550) Pulse Rate:  [83-115] 115 (02/17 0550) Resp:  [16] 16 (02/17 0550) BP: (130-143)/(79-102) 139/102 mmHg (02/17 0550) SpO2:  [96 %-100 %] 98 % (02/17 0550) Weight:  [77.111 kg (170 lb)] 77.111 kg (170 lb) (02/16 1304) Last BM Date: 12/17/15   Physical Exam General appearance: alert and no distress Resp: clear to auscultation bilaterally Cardio: Tachycardia GI: normal findings: bowel sounds normal and soft, non-tender Extremities: NVI   Assessment/Plan: [facility] Right acetabular fx/dislocation s/p CR, skeletal traction -- Plan for Dr. Carola Frost to assume care today, ?surgery Right tibia plateau fx s/p ex fix -- As above Seizure disorder -- Home meds FEN -- SL IV VTE -- SCD's, Lovenox Dispo -- Further procedures to follow     Freeman Caldron, PA-C Pager: [phone] General Trauma PA Pager: [phone]  12/19/2015

## 2015-12-19 NOTE — Progress Notes (Signed)
Nurse Tech from 5N in to take pt's cell phone and glasses back to his room.

## 2015-12-19 NOTE — Progress Notes (Addendum)
Met with pt this morning to discuss discharge planning.  PTA, pt independent, lives with brother.  He states he has a HHA through [employer] who comes on Wednesdays and Fridays from 1-4 to assist him with cleaning, cooking, getting groceries, etc.    Pt has 3 brothers; mother in [place] as well.  Pt states his brother will assist him until he goes to work, and then he can go to his mother's house.  Pt states he would agree to rehab if he needs it.   Pt for further surgery later today.  Will need PT/OT when able to tolerate therapies.  Will follow for recommendations.    Reinaldo Raddle, RN, BSN  Trauma/Neuro ICU Case Manager [phone]
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
## 2015-12-19 NOTE — Anesthesia Procedure Notes (Signed)
Procedure Name: Intubation Date/Time: 12/19/2015 1:36 PM Performed by: Lovie Chol Pre-anesthesia Checklist: Patient identified, Emergency Drugs available, Suction available, Patient being monitored and Timeout performed Patient Re-evaluated:Patient Re-evaluated prior to inductionOxygen Delivery Method: Circle system utilized Preoxygenation: Pre-oxygenation with 100% oxygen Intubation Type: IV induction Ventilation: Mask ventilation without difficulty Laryngoscope Size: Miller and 3 Grade View: Grade I Tube type: Oral Tube size: 8.0 mm Number of attempts: 1 Airway Equipment and Method: Stylet Placement Confirmation: ETT inserted through vocal cords under direct vision,  positive ETCO2,  CO2 detector and breath sounds checked- equal and bilateral Secured at: 24 cm Tube secured with: Tape Dental Injury: Teeth and Oropharynx as per pre-operative assessment
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
## 2015-12-21 LAB — CBC
HEMATOCRIT: 28.2 % — AB (ref 39.0–52.0)
HEMOGLOBIN: 9.7 g/dL — AB (ref 13.0–17.0)
MCH: 30.2 pg (ref 26.0–34.0)
MCHC: 34.4 g/dL (ref 30.0–36.0)
MCV: 87.9 fL (ref 78.0–100.0)
PLATELETS: 121 10*3/uL — AB (ref 150–400)
RBC: 3.21 MIL/uL — ABNORMAL LOW (ref 4.22–5.81)
RDW: 13.2 % (ref 11.5–15.5)
WBC: 9.6 10*3/uL (ref 4.0–10.5)

## 2015-12-21 LAB — BASIC METABOLIC PANEL
ANION GAP: 13 (ref 5–15)
BUN: <5 mg/dL — ABNORMAL LOW (ref 6–20)
CALCIUM: 8.5 mg/dL — AB (ref 8.9–10.3)
CO2: 26 mmol/L (ref 22–32)
Chloride: 96 mmol/L — ABNORMAL LOW (ref 101–111)
Creatinine, Ser: 0.69 mg/dL (ref 0.61–1.24)
Glucose, Bld: 113 mg/dL — ABNORMAL HIGH (ref 65–99)
POTASSIUM: 3.8 mmol/L (ref 3.5–5.1)
Sodium: 135 mmol/L (ref 135–145)
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
## 2015-12-21 NOTE — Progress Notes (Signed)
Subjective: 2 Days Post-Op Procedure(s) (LRB): OPEN REDUCTION INTERNAL FIXATION (ORIF) ACETABULAR FRACTURE (Right) OPEN REDUCTION INTERNAL FIXATION (ORIF) TIBIAL PLATEAU (Right) REMOVAL EXTERNAL FIXATION LEG (Right) Patient reports pain as 2 on 0-10 scale.    Objective: Vital signs in last 24 hours: Temp:  [99.1 F (37.3 C)-99.8 F (37.7 C)] 99.1 F (37.3 C) (02/19 0511) Pulse Rate:  [105-121] 105 (02/19 0511) Resp:  [16-18] 16 (02/19 0511) BP: (122-142)/(72-85) 142/85 mmHg (02/19 0511) SpO2:  [98 %-100 %] 100 % (02/19 0511)  Intake/Output from previous day: 02/18 0701 - 02/19 0700 In: 960 [P.O.:960] Out: 1900 [Urine:1900] Intake/Output this shift:    No results for input(s): HGB in the last 72 hours. No results for input(s): WBC, RBC, HCT, PLT in the last 72 hours. No results for input(s): NA, K, CL, CO2, BUN, CREATININE, GLUCOSE, CALCIUM in the last 72 hours. No results for input(s): LABPT, INR in the last 72 hours.  Neurologically intact ABD soft Neurovascular intact Sensation intact distally Intact pulses distally Dorsiflexion/Plantar flexion intact Incision: dressing C/D/I   Ordered CBC and BMET   Since one had been done post op and this patient still remains tachycardic  Assessment/Plan: 2 Days Post-Op Procedure(s) (LRB): OPEN REDUCTION INTERNAL FIXATION (ORIF) ACETABULAR FRACTURE (Right) OPEN REDUCTION INTERNAL FIXATION (ORIF) TIBIAL PLATEAU (Right) REMOVAL EXTERNAL FIXATION LEG (Right) Advance diet Up with therapy  Domenique Southers J 12/21/2015, 9:22 AM

## 2015-12-22 ENCOUNTER — Ambulatory Visit
Admit: 2015-12-22 | Discharge: 2015-12-22 | Disposition: A | Discharge status: Home / Self Care | Payer: Medicare Other | Attending: Radiation Oncology | Admitting: Radiation Oncology

## 2015-12-22 ENCOUNTER — Encounter: Payer: Self-pay | Admitting: Radiation Oncology

## 2015-12-22 ENCOUNTER — Encounter (HOSPITAL_COMMUNITY): Payer: Self-pay | Admitting: Orthopedic Surgery

## 2015-12-22 DIAGNOSIS — R569 Unspecified convulsions: Secondary | ICD-10-CM | POA: Insufficient documentation

## 2015-12-22 DIAGNOSIS — S32464B Nondisplaced associated transverse-posterior fracture of right acetabulum, initial encounter for open fracture: Secondary | ICD-10-CM

## 2015-12-22 DIAGNOSIS — S72091A Other fracture of head and neck of right femur, initial encounter for closed fracture: Secondary | ICD-10-CM | POA: Insufficient documentation

## 2015-12-22 DIAGNOSIS — R413 Other amnesia: Secondary | ICD-10-CM | POA: Insufficient documentation

## 2015-12-22 DIAGNOSIS — J45909 Unspecified asthma, uncomplicated: Secondary | ICD-10-CM | POA: Insufficient documentation

## 2015-12-22 DIAGNOSIS — Z9689 Presence of other specified functional implants: Secondary | ICD-10-CM | POA: Insufficient documentation

## 2015-12-22 DIAGNOSIS — S32401B Unspecified fracture of right acetabulum, initial encounter for open fracture: Secondary | ICD-10-CM | POA: Insufficient documentation

## 2015-12-22 DIAGNOSIS — Z9889 Other specified postprocedural states: Secondary | ICD-10-CM | POA: Insufficient documentation

## 2015-12-22 LAB — POCT I-STAT 4, (NA,K, GLUC, HGB,HCT)
Glucose, Bld: 106 mg/dL — ABNORMAL HIGH (ref 65–99)
HCT: 30 % — ABNORMAL LOW (ref 39.0–52.0)
HEMOGLOBIN: 10.2 g/dL — AB (ref 13.0–17.0)
POTASSIUM: 3.5 mmol/L (ref 3.5–5.1)
SODIUM: 136 mmol/L (ref 135–145)

## 2015-12-22 LAB — CBC
HEMATOCRIT: 29.2 % — AB (ref 39.0–52.0)
Hemoglobin: 10 g/dL — ABNORMAL LOW (ref 13.0–17.0)
MCH: 30.1 pg (ref 26.0–34.0)
MCHC: 34.2 g/dL (ref 30.0–36.0)
MCV: 88 fL (ref 78.0–100.0)
Platelets: 158 10*3/uL (ref 150–400)
RBC: 3.32 MIL/uL — ABNORMAL LOW (ref 4.22–5.81)
RDW: 13.1 % (ref 11.5–15.5)
WBC: 7.6 10*3/uL (ref 4.0–10.5)
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
## 2015-12-22 NOTE — Addendum Note (Signed)
Encounter addended by: Delray Alt, RN on: 12/22/2015  6:37 PM<BR>     Documentation filed: Notes Section
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
## 2015-12-22 NOTE — Progress Notes (Signed)
Spoke with Waynard Edwards, RN on [address]. She understands to call [facility] with report. Also, she understands patient is to arrive via [facility] at [facility] radiation oncology by 4 pm for one radiation treatment to surgically repair right acetabular fracture.

## 2015-12-22 NOTE — Progress Notes (Signed)
[facility] Radiation Oncology Dept Therapy Treatment Record Phone [phone]   Radiation Therapy was administered to Mark Chambers on: 12/22/2015  5:14 PM and was treatment # 1 out of a planned course of 1 treatments.

## 2015-12-22 NOTE — Care Management Important Message (Signed)
Important Message  Patient Details  Name: Mark Chambers MRN: [id number] Date of Birth: 04/20/1976   Medicare Important Message Given:  Yes    Bernadette Hoit 12/22/2015, 11:28 AM
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
## 2015-12-22 NOTE — Progress Notes (Signed)
Left Via [facility] at 6:35pm.

## 2015-12-22 NOTE — Progress Notes (Signed)
Patient ID: Mark Chambers, male   DOB: April 28, 1976, 40 y.o.   MRN: [id number]   LOS: 5 days   Subjective: Having a little more pain in his leg today but otherwise doing well.   Objective: Vital signs in last 24 hours: Temp:  [99 F (37.2 C)-99.9 F (37.7 C)] 99 F (37.2 C) (02/20 0808) Pulse Rate:  [104-110] 104 (02/20 0808) Resp:  [16-18] 16 (02/20 0808) BP: (125-131)/(79-80) 125/79 mmHg (02/20 0808) SpO2:  [97 %-100 %] 99 % (02/20 0808) Last BM Date: 12/17/15   Laboratory  CBC  Recent Labs  12/21/15 1016 12/22/15 0854  WBC 9.6 7.6  HGB 9.7* 10.0*  HCT 28.2* 29.2*  PLT 121* 158    Physical Exam General appearance: alert and no distress Resp: clear to auscultation bilaterally Cardio: regular rate and rhythm GI: normal findings: bowel sounds normal and soft, non-tender Extremities: NVI   Assessment/Plan: [facility] Right acetabular fx/dislocation s/p ORIF -- per dr handy Right tibia plateau fx s/p ORIF -- As above Seizure disorder -- Home meds FEN -- SL IV, D/C foley VTE -- SCD's, Lovenox Dispo -- Awaiting XRT, PT/OT    Freeman Caldron, PA-C Pager: [phone] General Trauma PA Pager: [phone]  12/22/2015

## 2015-12-22 NOTE — Progress Notes (Signed)
Mr. Mark Chambers completed radiation therapy at 5:35pm.  Lying on stretcher at this time within the nursing area.  He reports pain in his right knee as "below a level 5/10.  Waiting on pickup from    [facility] since end of treatment.  Called Twice.

## 2015-12-23 LAB — TYPE AND SCREEN
ABO/RH(D): A POS
Antibody Screen: NEGATIVE
Unit division: 0
Unit division: 0

## 2015-12-23 NOTE — Progress Notes (Signed)
OT Cancellation Note  Patient Details Name: Mark Chambers MRN: [id number] DOB: [birth year]   Cancelled Treatment:    Reason Eval/Treat Not Completed: Other (comment) Pt is Medicare and current D/C plan is SNF. No apparent immediate acute care OT needs, therefore will defer OT to SNF. If OT eval is needed please call Acute Rehab Dept. at [phone] or text page OT at [phone].    [name] [phone] 12/23/2015, 7:24 AM

## 2015-12-23 NOTE — Progress Notes (Signed)
PT recommending SNF at dc for continued therapy.  Pt has lack of caregiver support at home, and is agreeable to plan.  CSW following to facilitate dc to SNF when medically stable for dc, possibly tomorrow.    Quintella Baton, RN, BSN  Trauma/Neuro ICU Case Manager [phone]
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
## 2015-12-23 NOTE — Clinical Social Work Note (Signed)
Clinical Social Work Assessment  Patient Details  Name: Mark Chambers MRN: [id number] Date of Birth: 22-Oct-1976  Date of referral:  12/23/15               Reason for consult:  Facility Placement, Discharge Planning                Permission sought to share information with:  Family Supports, Magazine features editor Permission granted to share information::  Yes, Verbal Permission Granted  Name::     Mark Chambers  Agency::  SNF  Relationship::  Mother  Contact Information:  [phone]  Housing/Transportation Living arrangements for the past 2 months:  Single Family Home Source of Information:  Parent Patient Interpreter Needed:  None Criminal Activity/Legal Involvement Pertinent to Current Situation/Hospitalization:  No - Comment as needed Significant Relationships:  Parents, Siblings Lives with:  Self Do you feel safe going back to the place where you live?  Yes Need for family participation in patient care:  Yes (Comment)  Care giving concerns:  Patient lives at home alone. Patient has a care giver that comes on Wednesdays and Fridays for 3 hours. Patient mother stated that the company was called [employer]. Patient will likely need SNF for short-term rehab.   Social Worker assessment / plan:  BSW intern called patient mother to complete assessment. BSW intern explained to the patient mother that the patient would need a SNF for short-term rehab. Patient mother expressed to [facility] intern that she hoped the patient could be placed in a SNF located in or close to [place]. The family preference is [facility]. Patient mother gave BSW intern permission to fax out patient information to other facilities in [place] as an alternative to [facility]. CSW will continue to follow and assist as needed.  Employment status:  Other (Comment) (Not Employed) Insurance information:  Medicare PT Recommendations:  Skilled Nursing Facility Information / Referral to community  resources:  Skilled Nursing Facility  Patient/Family's Response to care:  Patient family seems to be appreciative of care that patient is currently receiving.   Patient/Family's Understanding of and Emotional Response to Diagnosis, Current Treatment, and Prognosis: Patient and patient family appear to understand the patient's current treatment and post discharge needs.  Emotional Assessment Appearance:  Appears stated age Attitude/Demeanor/Rapport:  Unable to Assess Affect (typically observed):  Unable to Assess Orientation:  Oriented to Self, Oriented to Place, Oriented to  Time, Oriented to Situation Alcohol / Substance use:  Not Applicable Psych involvement (Current and /or in the community):  No (Comment)  Discharge Needs  Concerns to be addressed:  No discharge needs identified Readmission within the last 30 days:  No Current discharge risk:  Lives alone Barriers to Discharge:  Continued Medical Work up    [employer], [phone]
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
## 2015-12-23 NOTE — Clinical Social Work Placement (Signed)
   CLINICAL SOCIAL WORK PLACEMENT  NOTE  Date:  12/23/2015  Patient Details  Name: Mark Chambers MRN: [id number] Date of Birth: 1976-04-26  Clinical Social Work is seeking post-discharge placement for this patient at the Skilled  Nursing Facility level of care (*CSW will initial, date and re-position this form in  chart as items are completed):  Yes   Patient/family provided with [facility] Clinical Social Work Department's list of facilities offering this level of care within the geographic area requested by the patient (or if unable, by the patient's family).  Yes   Patient/family informed of their freedom to choose among providers that offer the needed level of care, that participate in Medicare, Medicaid or managed care program needed by the patient, have an available bed and are willing to accept the patient.  Yes   Patient/family informed of [facility]'s ownership interest in [facility] and [facility], as well as of the fact that they are under no obligation to receive care at these facilities.  PASRR submitted to EDS on 12/23/15     PASRR number received on 12/23/15     Existing PASRR number confirmed on       FL2 transmitted to all facilities in geographic area requested by pt/family on 12/23/15     FL2 transmitted to all facilities within larger geographic area on       Patient informed that his/her managed care company has contracts with or will negotiate with certain facilities, including the following:            Patient/family informed of bed offers received.  Patient chooses bed at       Physician recommends and patient chooses bed at      Patient to be transferred to   on  .  Patient to be transferred to facility by       Patient family notified on   of transfer.  Name of family member notified:        PHYSICIAN Please sign FL2, Please prepare priority discharge summary, including medications     Additional Comment:    Macario Golds,  LCSW [phone]

## 2015-12-24 LAB — CREATININE, SERUM
CREATININE: 0.81 mg/dL (ref 0.61–1.24)
GFR calc non Af Amer: >60 mL/min (ref >60)

## 2015-12-24 MED ORDER — DOCUSATE SODIUM 100 MG PO CAPS
200.0000 mg | ORAL_CAPSULE | Freq: Two times a day (BID) | ORAL | Status: DC
  Administered 2015-12-24 – 2015-12-25 (×3): 200 mg via ORAL
  Filled 2015-12-24 (×3): qty 2

## 2015-12-24 MED ORDER — MAGNESIUM CITRATE PO SOLN
1.0000 | Freq: Two times a day (BID) | ORAL | Status: AC
  Administered 2015-12-24: 1 via ORAL
  Filled 2015-12-24: qty 296
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
## 2015-12-25 ENCOUNTER — Encounter: Payer: Self-pay | Admitting: Physical Medicine & Rehabilitation

## 2015-12-25 ENCOUNTER — Other Ambulatory Visit: Payer: Self-pay | Admitting: Physical Medicine & Rehabilitation

## 2015-12-25 DIAGNOSIS — Z4789 Encounter for other orthopedic aftercare: Secondary | ICD-10-CM | POA: Diagnosis not present

## 2015-12-25 DIAGNOSIS — S32421D Displaced fracture of posterior wall of right acetabulum, subsequent encounter for fracture with routine healing: Secondary | ICD-10-CM | POA: Diagnosis not present

## 2015-12-25 DIAGNOSIS — S82141D Displaced bicondylar fracture of right tibia, subsequent encounter for closed fracture with routine healing: Secondary | ICD-10-CM | POA: Diagnosis not present

## 2015-12-25 DIAGNOSIS — S82001D Unspecified fracture of right patella, subsequent encounter for closed fracture with routine healing: Secondary | ICD-10-CM | POA: Diagnosis not present

## 2015-12-25 DIAGNOSIS — F339 Major depressive disorder, recurrent, unspecified: Secondary | ICD-10-CM | POA: Diagnosis not present

## 2015-12-25 DIAGNOSIS — G40909 Epilepsy, unspecified, not intractable, without status epilepticus: Secondary | ICD-10-CM | POA: Diagnosis not present

## 2015-12-25 DIAGNOSIS — R413 Other amnesia: Secondary | ICD-10-CM | POA: Diagnosis not present

## 2015-12-25 DIAGNOSIS — S82142D Displaced bicondylar fracture of left tibia, subsequent encounter for closed fracture with routine healing: Secondary | ICD-10-CM | POA: Diagnosis not present

## 2015-12-25 DIAGNOSIS — J45901 Unspecified asthma with (acute) exacerbation: Secondary | ICD-10-CM | POA: Diagnosis not present

## 2015-12-25 DIAGNOSIS — S73004D Unspecified dislocation of right hip, subsequent encounter: Secondary | ICD-10-CM | POA: Diagnosis not present

## 2015-12-25 DIAGNOSIS — S32409A Unspecified fracture of unspecified acetabulum, initial encounter for closed fracture: Secondary | ICD-10-CM | POA: Diagnosis not present

## 2015-12-25 DIAGNOSIS — S32401D Unspecified fracture of right acetabulum, subsequent encounter for fracture with routine healing: Secondary | ICD-10-CM | POA: Diagnosis not present

## 2015-12-25 DIAGNOSIS — S82191D Other fracture of upper end of right tibia, subsequent encounter for closed fracture with routine healing: Secondary | ICD-10-CM | POA: Diagnosis not present

## 2015-12-25 DIAGNOSIS — S062X0D Diffuse traumatic brain injury without loss of consciousness, subsequent encounter: Secondary | ICD-10-CM | POA: Diagnosis not present

## 2015-12-25 DIAGNOSIS — Z9001 Acquired absence of eye: Secondary | ICD-10-CM | POA: Diagnosis not present

## 2015-12-25 DIAGNOSIS — K5900 Constipation, unspecified: Secondary | ICD-10-CM | POA: Diagnosis not present

## 2015-12-25 DIAGNOSIS — H5442 Blindness, left eye, normal vision right eye: Secondary | ICD-10-CM | POA: Diagnosis not present

## 2015-12-25 DIAGNOSIS — R262 Difficulty in walking, not elsewhere classified: Secondary | ICD-10-CM | POA: Diagnosis not present

## 2015-12-25 DIAGNOSIS — S82201A Unspecified fracture of shaft of right tibia, initial encounter for closed fracture: Secondary | ICD-10-CM | POA: Diagnosis not present

## 2015-12-25 DIAGNOSIS — S01511A Laceration without foreign body of lip, initial encounter: Secondary | ICD-10-CM | POA: Diagnosis present

## 2015-12-25 DIAGNOSIS — R569 Unspecified convulsions: Secondary | ICD-10-CM | POA: Diagnosis not present

## 2015-12-25 DIAGNOSIS — M6281 Muscle weakness (generalized): Secondary | ICD-10-CM | POA: Diagnosis not present

## 2015-12-25 DIAGNOSIS — S72001D Fracture of unspecified part of neck of right femur, subsequent encounter for closed fracture with routine healing: Secondary | ICD-10-CM | POA: Diagnosis not present

## 2015-12-25 MED ORDER — DOCUSATE SODIUM 100 MG PO CAPS: 200.0000 mg | ORAL_CAPSULE | Freq: Two times a day (BID) | ORAL | Status: DC

## 2015-12-25 MED ORDER — POLYETHYLENE GLYCOL 3350 17 G PO PACK: 17.0000 g | PACK | Freq: Every day | ORAL | Status: DC

## 2015-12-25 MED ORDER — OXYCODONE-ACETAMINOPHEN 7.5-325 MG PO TABS: 1.0000 | ORAL_TABLET | ORAL | Status: DC | PRN

## 2015-12-25 MED ORDER — ENOXAPARIN SODIUM 40 MG/0.4ML ~~LOC~~ SOLN: 40.0000 mg | SUBCUTANEOUS | Status: DC

## 2015-12-25 MED ORDER — METHOCARBAMOL 500 MG PO TABS: 500.0000 mg | ORAL_TABLET | Freq: Four times a day (QID) | ORAL | Status: DC | PRN

## 2015-12-25 NOTE — Progress Notes (Signed)
[name] Jefcoat discharged to SNF [facility]) per MD order. All questions and concerns answered. IV removed. RN called twice in attempt to give report to SNF at [phone]. No response. RN will await a call regarding report from facility.  Patient escorted by [facility]. No distress noted upon discharge.   Mark Chambers R 12/25/2015 11:55 AM
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
## 2015-12-25 NOTE — Progress Notes (Signed)
Report given to Francia Greaves, DON at [facility].

## 2015-12-25 NOTE — Care Management Important Message (Signed)
Important Message  Patient Details  Name: Mark Chambers MRN: [id number] Date of Birth: [birth year]   Medicare Important Message Given:  Yes    [name] 12/25/2015, 4:36 PM

## 2015-12-25 NOTE — Clinical Social Work Placement (Signed)
   CLINICAL SOCIAL WORK PLACEMENT  NOTE  Date:  12/25/2015  Patient Details  Name: Mark Chambers MRN: [id number] Date of Birth: 08/04/1976  Clinical Social Work is seeking post-discharge placement for this patient at the Skilled  Nursing Facility level of care (*CSW will initial, date and re-position this form in  chart as items are completed):  Yes   Patient/family provided with [facility] Clinical Social Work Department's list of facilities offering this level of care within the geographic area requested by the patient (or if unable, by the patient's family).  Yes   Patient/family informed of their freedom to choose among providers that offer the needed level of care, that participate in Medicare, Medicaid or managed care program needed by the patient, have an available bed and are willing to accept the patient.  Yes   Patient/family informed of [facility]'s ownership interest in [facility] and [facility], as well as of the fact that they are under no obligation to receive care at these facilities.  PASRR submitted to EDS on 12/23/15     PASRR number received on 12/23/15     Existing PASRR number confirmed on       FL2 transmitted to all facilities in geographic area requested by pt/family on 12/23/15     FL2 transmitted to all facilities within larger geographic area on       Patient informed that his/her managed care company has contracts with or will negotiate with certain facilities, including the following:        Yes   Patient/family informed of bed offers received.  Patient chooses bed at [facility]     Physician recommends and patient chooses bed at      Patient to be transferred to [facility] on 12/25/15.  Patient to be transferred to facility by AMbul[facility]     Patient family notified on 12/25/15 of transfer.  Name of family member notified:  Loel Lofty     PHYSICIAN Please sign FL2, Please prepare priority discharge summary,  including medications     Additional Comment:   Per MD patient ready for DC to [facility]. RN, patient, patient's family, and facility notified of DC. RN given number for report. DC packet on chart. Ambulance transport requested for patient. CSW signing off.  _______________________________________________ Venita Lick, LCSW 12/25/2015, 10:41 AM

## 2015-12-25 NOTE — Progress Notes (Signed)
Patient ID: Mark Chambers, male   DOB: 02-14-1976, 40 y.o.   MRN: [id number]   LOS: 8 days   Subjective: No new c/o.   Objective: Vital signs in last 24 hours: Temp:  [98.5 F (36.9 C)-98.7 F (37.1 C)] 98.7 F (37.1 C) (02/23 0620) Pulse Rate:  [100-105] 105 (02/23 0620) Resp:  [18] 18 (02/23 0620) BP: (111-126)/(71-78) 111/75 mmHg (02/23 0620) SpO2:  [97 %-100 %] 100 % (02/23 0620) Last BM Date: 12/24/15   Physical Exam General appearance: alert and no distress Resp: clear to auscultation bilaterally Cardio: Mild tachycardia GI: normal findings: bowel sounds normal and soft, non-tender   Assessment/Plan: [facility] Right acetabular fx/dislocation s/p ORIF -- per dr handy Right tibia plateau fx s/p ORIF -- As above Seizure disorder -- Home meds FEN -- No issues VTE -- SCD's, Lovenox Dispo -- SNF today    Freeman Caldron, PA-C Pager: [phone] General Trauma PA Pager: [phone]  12/25/2015

## 2015-12-26 DIAGNOSIS — Z4789 Encounter for other orthopedic aftercare: Secondary | ICD-10-CM | POA: Diagnosis not present

## 2015-12-30 ENCOUNTER — Telehealth (HOSPITAL_COMMUNITY): Payer: Self-pay
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
## 2016-01-07 DIAGNOSIS — S32421D Displaced fracture of posterior wall of right acetabulum, subsequent encounter for fracture with routine healing: Secondary | ICD-10-CM | POA: Diagnosis not present

## 2016-01-07 DIAGNOSIS — S82141D Displaced bicondylar fracture of right tibia, subsequent encounter for closed fracture with routine healing: Secondary | ICD-10-CM | POA: Diagnosis not present

## 2016-01-16 DIAGNOSIS — Z4789 Encounter for other orthopedic aftercare: Secondary | ICD-10-CM | POA: Diagnosis not present

## 2016-01-19 ENCOUNTER — Ambulatory Visit: Payer: Medicare Other | Attending: Orthopedic Surgery | Admitting: Physical Therapy

## 2016-01-19 DIAGNOSIS — M25562 Pain in left knee: Secondary | ICD-10-CM | POA: Diagnosis not present

## 2016-01-19 DIAGNOSIS — R269 Unspecified abnormalities of gait and mobility: Secondary | ICD-10-CM | POA: Insufficient documentation

## 2016-01-19 DIAGNOSIS — M25551 Pain in right hip: Secondary | ICD-10-CM | POA: Diagnosis not present

## 2016-01-19 DIAGNOSIS — R262 Difficulty in walking, not elsewhere classified: Secondary | ICD-10-CM | POA: Insufficient documentation

## 2016-01-19 DIAGNOSIS — R29898 Other symptoms and signs involving the musculoskeletal system: Secondary | ICD-10-CM | POA: Insufficient documentation

## 2016-01-19 DIAGNOSIS — M25661 Stiffness of right knee, not elsewhere classified: Secondary | ICD-10-CM

## 2016-01-19 DIAGNOSIS — M24651 Ankylosis, right hip: Secondary | ICD-10-CM | POA: Insufficient documentation

## 2016-01-19 NOTE — Patient Instructions (Signed)
Gastroc / Heel Cord Stretch - Seated With Towel   Sit on floor, towel around ball of foot. Gently pull foot in toward body, stretching heel cord and calf. Hold for30 ___ seconds. Repeat on involved right leg Repeat _3__ times. Do 3___ times per day.  Copyright  VHI. All rights reserved.  Marland Kitchen.  Heel Slide   Bend knee and pull heel toward buttocks. Hold _3-5___ seconds. Return. Repeat with other knee. Repeat __10__ times. Do __2-3__ sessions per day.  http://gt2.exer.us/372   Copyright  VHI. All rights reserved.     Raise leg until knee is straight. __10_ reps per set, 3___ sets per day, __7_ days per week  Copyright  VHI. All rights reserved.  [employer]   Place a large can or rolled towel under leg. Straighten knee and leg. Hold _3-5___ seconds. Repeat with other leg. Repeat 10 x 3____ times. Do 1-2____ sessions per day.  http://gt2.exer.us/365   Copyright  VHI. All rights reserved.  Quad Set   Slowly tighten muscles on thigh of straight leg while counting out loud to _5___. Repeat with other leg. Repeat 10 x 5____ times. Do 1-2____ sessions per day. When watching commercial smash knee on towel roll like shown in clinic  http://gt2.exer.us/361   Copyright  VHI. All rights reserved.  Garen LahLawrie Mouhamad Chambers, PT 01/19/2016 2:50 PM Phone: [phone] Fax: [phone]
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
## 2016-01-26 ENCOUNTER — Ambulatory Visit: Payer: Medicare Other

## 2016-01-26 DIAGNOSIS — M25661 Stiffness of right knee, not elsewhere classified: Secondary | ICD-10-CM

## 2016-01-26 DIAGNOSIS — R269 Unspecified abnormalities of gait and mobility: Secondary | ICD-10-CM

## 2016-01-26 DIAGNOSIS — R29898 Other symptoms and signs involving the musculoskeletal system: Secondary | ICD-10-CM | POA: Diagnosis not present

## 2016-01-26 DIAGNOSIS — M24651 Ankylosis, right hip: Secondary | ICD-10-CM

## 2016-01-26 DIAGNOSIS — R262 Difficulty in walking, not elsewhere classified: Secondary | ICD-10-CM

## 2016-01-26 DIAGNOSIS — M25551 Pain in right hip: Secondary | ICD-10-CM | POA: Diagnosis not present

## 2016-01-26 DIAGNOSIS — M25562 Pain in left knee: Secondary | ICD-10-CM | POA: Diagnosis not present
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
## 2016-01-26 NOTE — Patient Instructions (Addendum)
10 x each, 2 sets, 2-3 times a day.    Abduction / Adduction  Feet hip width apart, spread thighs out, then bring thighs together. Repeat ___ times each direction. Do ___ sessions per day. Do with ______ colored band around thighs. Note: If possible, place feet on floor.    Ankle Pump  Bend ankles up and down, alternating feet. Repeat ____ times. Do ____ sessions per day.  Quad Set  With other leg bent, foot flat, slowly tighten muscles on thigh of straight leg while counting out loud to ____. Repeat with other leg. Repeat ____ times. Do ____ sessions per day.   Heel Slide  Bend left knee and pull heel toward buttocks. Use sheet, strap, or rope to actively assist knee further into flexion. Hold stretch 10 secs.  Repeat ____ times. Do ____ sessions per day.  Short Arc [employer] CompanyQuad    Place a large can or rolled towel under leg. Straighten knee and leg. Hold __5__ seconds. Repeat with other leg. Repeat _10___ times. Do ___3_ sessions per day.  http://gt2.exer.us/365   Copyright  VHI. All rights reserved.     KNEE: Extension, Long Arc Quads - Sitting   Raise leg until knee is straight. ___ reps per set, ___ sets per day, ___ days per week  FLEXION: Sitting (Active)  Sit, both feet flat. Lift right knee toward ceiling. Use ___ lbs. Complete ___ sets of ___ repetitions. Perform ___ sessions per day.  Copyright  VHI. All rights reserved.

## 2016-01-27 ENCOUNTER — Encounter: Payer: Self-pay | Admitting: Physical Therapy

## 2016-02-02 ENCOUNTER — Ambulatory Visit: Payer: Medicare Other | Attending: Orthopedic Surgery | Admitting: Physical Therapy

## 2016-02-02 DIAGNOSIS — M25552 Pain in left hip: Secondary | ICD-10-CM | POA: Diagnosis not present

## 2016-02-02 DIAGNOSIS — M25562 Pain in left knee: Secondary | ICD-10-CM | POA: Insufficient documentation

## 2016-02-02 DIAGNOSIS — R2689 Other abnormalities of gait and mobility: Secondary | ICD-10-CM | POA: Insufficient documentation

## 2016-02-02 DIAGNOSIS — M25661 Stiffness of right knee, not elsewhere classified: Secondary | ICD-10-CM | POA: Diagnosis not present

## 2016-02-02 DIAGNOSIS — M25651 Stiffness of right hip, not elsewhere classified: Secondary | ICD-10-CM

## 2016-02-02 DIAGNOSIS — R262 Difficulty in walking, not elsewhere classified: Secondary | ICD-10-CM | POA: Insufficient documentation
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
## 2016-02-03 ENCOUNTER — Encounter: Payer: Self-pay | Admitting: Physical Therapy

## 2016-02-04 ENCOUNTER — Ambulatory Visit: Payer: Medicare Other | Admitting: Physical Therapy

## 2016-02-04 DIAGNOSIS — R2689 Other abnormalities of gait and mobility: Secondary | ICD-10-CM

## 2016-02-04 DIAGNOSIS — M25661 Stiffness of right knee, not elsewhere classified: Secondary | ICD-10-CM | POA: Diagnosis not present

## 2016-02-04 DIAGNOSIS — M25651 Stiffness of right hip, not elsewhere classified: Secondary | ICD-10-CM | POA: Diagnosis not present

## 2016-02-04 DIAGNOSIS — M25562 Pain in left knee: Secondary | ICD-10-CM

## 2016-02-04 DIAGNOSIS — M25552 Pain in left hip: Secondary | ICD-10-CM | POA: Diagnosis not present

## 2016-02-04 DIAGNOSIS — R262 Difficulty in walking, not elsewhere classified: Secondary | ICD-10-CM | POA: Diagnosis not present
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
## 2016-02-05 ENCOUNTER — Encounter: Payer: Self-pay | Admitting: Physical Therapy

## 2016-02-08 NOTE — Progress Notes (Signed)
  Radiation Oncology         [phone] ________________________________  Name: Mark Chambers MRN: [id number]  Date: 12/22/2015  DOB: 08/07/1976  End of Treatment Note  Diagnosis:   40 yo man at risk for heterotopic ossification     Indication for treatment:  Curative       Radiation treatment dates:   12/22/15  Site/dose:   The right hip was treated to 7 Gy in one fraction  Beams/energy:   The hip was treated AP PA with 6 MV X-rays.  Narrative: The patient tolerated radiation treatment relatively well.     Plan: The patient has completed radiation treatment.  ________________________________  Artist PaisMatthew A. Kathrynn RunningManning, M.D.

## 2016-02-08 NOTE — Progress Notes (Signed)
  Radiation Oncology         [phone] ________________________________  Name: Aretta NipMelvin Donta Mcinroy MRN: [id number]  Date: 06/19/2015  DOB: 03/19/1955  INPATIENT  SIMULATION AND TREATMENT PLANNING NOTE    ICD-9-CM ICD-10-CM   1. Acetabular fracture, right, sequela 905.1 S32.402S     DIAGNOSIS:  Risk for heterotopic ossification  NARRATIVE:  The patient was brought to the linac suite.  Identity was confirmed.  All relevant records and images related to the planned course of therapy were reviewed.  The patient freely provided informed written consent to proceed with treatment after reviewing the details related to the planned course of therapy. The consent form was witnessed and verified by the simulation staff.  Then, the patient was set-up in a stable reproducible  supine position for radiation therapy.  Treattment images were obtained.  Surface markings were placed.  The images were loaded into the planning software.  Then the target and avoidance structures were contoured.  Treatment planning then occurred.  The radiation prescription was entered and confirmed.  Then, I designed and supervised the construction of a total of zero medically necessary complex treatment devices.  I have requested : a simple dose calculation.    PLAN:  The patient will receive 7 Gy in 1 fraction.  ________________________________  Artist PaisMatthew A. Kathrynn RunningManning, M.D.

## 2016-02-09 ENCOUNTER — Encounter: Payer: Self-pay | Admitting: Physical Therapy

## 2016-02-10 ENCOUNTER — Encounter: Payer: Self-pay | Admitting: Physical Therapy

## 2016-02-11 ENCOUNTER — Ambulatory Visit: Payer: Medicare Other | Admitting: Physical Therapy

## 2016-02-11 DIAGNOSIS — M25651 Stiffness of right hip, not elsewhere classified: Secondary | ICD-10-CM | POA: Diagnosis not present

## 2016-02-11 DIAGNOSIS — M25562 Pain in left knee: Secondary | ICD-10-CM

## 2016-02-11 DIAGNOSIS — M25661 Stiffness of right knee, not elsewhere classified: Secondary | ICD-10-CM

## 2016-02-11 DIAGNOSIS — R2689 Other abnormalities of gait and mobility: Secondary | ICD-10-CM

## 2016-02-11 DIAGNOSIS — R262 Difficulty in walking, not elsewhere classified: Secondary | ICD-10-CM | POA: Diagnosis not present

## 2016-02-11 DIAGNOSIS — M25552 Pain in left hip: Secondary | ICD-10-CM

## 2016-02-11 NOTE — Patient Instructions (Signed)
Use walker non weight bearing to touch down weight bearing for all walking. Avoid pivoting on feet, take as many small steps to turn as you need.

## 2016-02-11 NOTE — Addendum Note (Signed)
Addended by: Haze RushingENZI, Bueford Arp on: 02/11/2016 05:57 PM   Modules accepted: Orders
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
## 2016-02-12 ENCOUNTER — Encounter: Payer: Self-pay | Admitting: Physical Therapy

## 2016-02-16 ENCOUNTER — Ambulatory Visit: Payer: Medicare Other | Admitting: Physical Therapy

## 2016-02-16 ENCOUNTER — Encounter: Payer: Self-pay | Admitting: Physical Therapy

## 2016-02-16 DIAGNOSIS — M25661 Stiffness of right knee, not elsewhere classified: Secondary | ICD-10-CM | POA: Diagnosis not present

## 2016-02-16 DIAGNOSIS — M25651 Stiffness of right hip, not elsewhere classified: Secondary | ICD-10-CM

## 2016-02-16 DIAGNOSIS — R262 Difficulty in walking, not elsewhere classified: Secondary | ICD-10-CM | POA: Diagnosis not present

## 2016-02-16 DIAGNOSIS — M25552 Pain in left hip: Secondary | ICD-10-CM | POA: Diagnosis not present

## 2016-02-16 DIAGNOSIS — R2689 Other abnormalities of gait and mobility: Secondary | ICD-10-CM

## 2016-02-16 DIAGNOSIS — M25562 Pain in left knee: Secondary | ICD-10-CM | POA: Diagnosis not present
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
## 2016-02-17 ENCOUNTER — Encounter: Payer: Self-pay | Admitting: Physical Therapy

## 2016-02-18 ENCOUNTER — Encounter: Payer: Self-pay | Admitting: Physical Therapy

## 2016-02-18 ENCOUNTER — Ambulatory Visit: Payer: Medicare Other | Admitting: Physical Therapy

## 2016-02-18 DIAGNOSIS — M25661 Stiffness of right knee, not elsewhere classified: Secondary | ICD-10-CM | POA: Diagnosis not present

## 2016-02-18 DIAGNOSIS — R262 Difficulty in walking, not elsewhere classified: Secondary | ICD-10-CM | POA: Diagnosis not present

## 2016-02-18 DIAGNOSIS — M25651 Stiffness of right hip, not elsewhere classified: Secondary | ICD-10-CM

## 2016-02-18 DIAGNOSIS — R2689 Other abnormalities of gait and mobility: Secondary | ICD-10-CM

## 2016-02-18 DIAGNOSIS — M25562 Pain in left knee: Secondary | ICD-10-CM

## 2016-02-18 DIAGNOSIS — M25552 Pain in left hip: Secondary | ICD-10-CM
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
## 2016-02-19 ENCOUNTER — Encounter: Payer: Self-pay | Admitting: Physical Therapy

## 2016-02-23 ENCOUNTER — Ambulatory Visit: Payer: Medicare Other | Admitting: Physical Therapy

## 2016-02-23 ENCOUNTER — Encounter: Payer: Self-pay | Admitting: Physical Therapy

## 2016-02-23 DIAGNOSIS — M25661 Stiffness of right knee, not elsewhere classified: Secondary | ICD-10-CM | POA: Diagnosis not present

## 2016-02-23 DIAGNOSIS — M25552 Pain in left hip: Secondary | ICD-10-CM | POA: Diagnosis not present

## 2016-02-23 DIAGNOSIS — R2689 Other abnormalities of gait and mobility: Secondary | ICD-10-CM | POA: Diagnosis not present

## 2016-02-23 DIAGNOSIS — M25562 Pain in left knee: Secondary | ICD-10-CM | POA: Diagnosis not present

## 2016-02-23 DIAGNOSIS — M25651 Stiffness of right hip, not elsewhere classified: Secondary | ICD-10-CM | POA: Diagnosis not present

## 2016-02-23 DIAGNOSIS — R262 Difficulty in walking, not elsewhere classified: Secondary | ICD-10-CM | POA: Diagnosis not present
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
## 2016-02-24 ENCOUNTER — Encounter: Payer: Self-pay | Admitting: Physical Therapy

## 2016-02-25 ENCOUNTER — Ambulatory Visit: Payer: Medicare Other | Admitting: Physical Therapy

## 2016-02-25 ENCOUNTER — Telehealth: Payer: Self-pay | Admitting: Physical Therapy

## 2016-02-25 DIAGNOSIS — M25562 Pain in left knee: Secondary | ICD-10-CM | POA: Diagnosis not present

## 2016-02-25 DIAGNOSIS — M25651 Stiffness of right hip, not elsewhere classified: Secondary | ICD-10-CM | POA: Diagnosis not present

## 2016-02-25 DIAGNOSIS — M25661 Stiffness of right knee, not elsewhere classified: Secondary | ICD-10-CM | POA: Diagnosis not present

## 2016-02-25 DIAGNOSIS — R262 Difficulty in walking, not elsewhere classified: Secondary | ICD-10-CM

## 2016-02-25 DIAGNOSIS — M25552 Pain in left hip: Secondary | ICD-10-CM | POA: Diagnosis not present

## 2016-02-25 DIAGNOSIS — R2689 Other abnormalities of gait and mobility: Secondary | ICD-10-CM
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
## 2016-03-01 NOTE — Telephone Encounter (Signed)
See previous note

## 2016-03-03 DIAGNOSIS — S82141D Displaced bicondylar fracture of right tibia, subsequent encounter for closed fracture with routine healing: Secondary | ICD-10-CM | POA: Diagnosis not present

## 2016-03-03 DIAGNOSIS — S32421D Displaced fracture of posterior wall of right acetabulum, subsequent encounter for fracture with routine healing: Secondary | ICD-10-CM | POA: Diagnosis not present

## 2016-03-31 DIAGNOSIS — S32421D Displaced fracture of posterior wall of right acetabulum, subsequent encounter for fracture with routine healing: Secondary | ICD-10-CM | POA: Diagnosis not present

## 2016-03-31 DIAGNOSIS — S82141D Displaced bicondylar fracture of right tibia, subsequent encounter for closed fracture with routine healing: Secondary | ICD-10-CM | POA: Diagnosis not present

## 2016-05-28 ENCOUNTER — Telehealth: Payer: Self-pay | Admitting: Physical Medicine & Rehabilitation

## 2016-05-28 NOTE — Telephone Encounter (Signed)
Mark Chambers with ([employer] worker's comp)needs a pier review for patients medications.  Please call Mark Chambers at [phone].

## 2016-05-28 NOTE — Telephone Encounter (Signed)
I spoke with Lurena Joiner and they are needing a peer review with Dr Riley Kill.

## 2016-05-31 ENCOUNTER — Telehealth: Payer: Self-pay

## 2016-05-31 NOTE — Telephone Encounter (Signed)
He needs a follow up visit with me first before I discuss any medications in a peer to peer. He was last seen in November 2016

## 2016-05-31 NOTE — Telephone Encounter (Signed)
Can you please schedule an appointment with ZS?

## 2016-05-31 NOTE — Telephone Encounter (Signed)
[employer] called in regards to pt's worker's comp. They need to set up a peer review regarding the pt's medications. [employer] would like a call back to see when you can schedule a phone call to speak with a physician about his medications. They can be reached at [phone].

## 2016-06-03 NOTE — Telephone Encounter (Signed)
Left a message for patient to call and schedule an appt.

## 2016-06-16 ENCOUNTER — Encounter: Payer: Medicare Other | Attending: Physical Medicine & Rehabilitation | Admitting: Physical Medicine & Rehabilitation

## 2016-08-19 ENCOUNTER — Other Ambulatory Visit: Payer: Self-pay | Admitting: Physical Medicine & Rehabilitation

## 2016-08-19 NOTE — Telephone Encounter (Signed)
Refill request for keppra rejected because we have not seen Mr Mark Chambers since 09/2015 and have asked that he make appt and he had not complied

## 2016-09-29 ENCOUNTER — Encounter: Payer: Self-pay | Admitting: Physical Medicine & Rehabilitation

## 2016-09-29 ENCOUNTER — Encounter (INDEPENDENT_AMBULATORY_CARE_PROVIDER_SITE_OTHER): Payer: Self-pay

## 2016-09-29 ENCOUNTER — Encounter
Payer: Worker's Compensation | Attending: Physical Medicine & Rehabilitation | Admitting: Physical Medicine & Rehabilitation

## 2016-09-29 VITALS — BP 118/79 | HR 51 | Resp 14

## 2016-09-29 DIAGNOSIS — Z5189 Encounter for other specified aftercare: Secondary | ICD-10-CM | POA: Diagnosis not present

## 2016-09-29 DIAGNOSIS — S0572XD Avulsion of left eye, subsequent encounter: Secondary | ICD-10-CM | POA: Diagnosis not present

## 2016-09-29 DIAGNOSIS — G3189 Other specified degenerative diseases of nervous system: Secondary | ICD-10-CM

## 2016-09-29 DIAGNOSIS — G4700 Insomnia, unspecified: Secondary | ICD-10-CM | POA: Insufficient documentation

## 2016-09-29 DIAGNOSIS — F09 Unspecified mental disorder due to known physiological condition: Secondary | ICD-10-CM | POA: Diagnosis not present

## 2016-09-29 DIAGNOSIS — S069X0S Unspecified intracranial injury without loss of consciousness, sequela: Secondary | ICD-10-CM | POA: Diagnosis not present

## 2016-09-29 DIAGNOSIS — G40909 Epilepsy, unspecified, not intractable, without status epilepticus: Secondary | ICD-10-CM

## 2016-09-29 DIAGNOSIS — S06890D Other specified intracranial injury without loss of consciousness, subsequent encounter: Secondary | ICD-10-CM | POA: Insufficient documentation

## 2016-09-29 DIAGNOSIS — S069XAS Unspecified intracranial injury with loss of consciousness status unknown, sequela: Secondary | ICD-10-CM

## 2016-09-29 MED ORDER — LEVETIRACETAM ER 500 MG PO TB24: 1000.0000 mg | ORAL_TABLET | Freq: Every day | ORAL | 5 refills | Status: DC

## 2016-09-29 NOTE — Patient Instructions (Addendum)
WORK ON ESTABLISHING SOME GOALS---PUT THEM IN WRITING, SHORT AND LONG TERM  KEEP TRACK ON HOW YOU'RE PROGRESSING TOWARD GOALS  IF YOU FEEL LIKE THINGS ARE GETTING WORSE, LET ME KNOW   PLEASE CALL ME WITH ANY PROBLEMS OR QUESTIONS [phone])   HAPPY HOLIDAYS!!!!                    *                * *             *   *   *         *  *   *  *  *     *  *  *  *  *  *  * *  *  *  *  *  *  *  *  *  * *               *  *               *  *               *  *
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
## 2016-12-20 ENCOUNTER — Encounter: Payer: Medicare Other | Attending: Physical Medicine & Rehabilitation | Admitting: Physical Medicine & Rehabilitation

## 2016-12-29 ENCOUNTER — Ambulatory Visit: Payer: Self-pay | Admitting: Physical Medicine & Rehabilitation

## 2017-04-11 ENCOUNTER — Other Ambulatory Visit: Payer: Self-pay | Admitting: Physical Medicine & Rehabilitation

## 2017-06-26 ENCOUNTER — Other Ambulatory Visit: Payer: Self-pay | Admitting: Physical Medicine & Rehabilitation

## 2017-07-25 ENCOUNTER — Other Ambulatory Visit: Payer: Self-pay | Admitting: Physical Medicine & Rehabilitation

## 2017-08-12 ENCOUNTER — Other Ambulatory Visit: Payer: Self-pay | Admitting: Physical Medicine & Rehabilitation

## 2017-08-16 ENCOUNTER — Other Ambulatory Visit: Payer: Self-pay | Admitting: Physical Medicine & Rehabilitation

## 2017-08-16 ENCOUNTER — Telehealth: Payer: Self-pay

## 2017-08-16 NOTE — Telephone Encounter (Signed)
Pharmacy called about getting refill on keppra Mark Chambers has not been seen in this office since Nov 2017. Called the pharmacy explaining that we can not fill prescription until patient is seen in our office

## 2017-08-17 ENCOUNTER — Other Ambulatory Visit: Payer: Self-pay | Admitting: Physical Medicine & Rehabilitation

## 2017-08-17 MED ORDER — LEVETIRACETAM ER 500 MG PO TB24: 1000.0000 mg | ORAL_TABLET | Freq: Every day | ORAL | 0 refills | Status: DC

## 2017-08-17 NOTE — Telephone Encounter (Signed)
After speaking with Dr Riley KillSwartz and Mr Katrinka BlazingSmith, he will come to his 08/31/17 appt and a one month refill has been granted.

## 2017-08-17 NOTE — Addendum Note (Signed)
Addended by: Doreene ElandSHUMAKER, SYBIL W on: 08/17/2017 09:42 AM   Modules accepted: Orders

## 2017-08-17 NOTE — Telephone Encounter (Signed)
Refill sent to pharmacy (and phoned in) Mr Mark Chambers will be at his 08/31/17 appt.

## 2017-08-21 IMAGING — CR DG PELVIS 3+V JUDET
3 series · 3 of 3 positions shown · non-contrast
Comparison: CT dated 12/18/2015

CLINICAL DATA: 39-year-old male status post ORIF of the right
acetabulum

EXAM:
JUDET PELVIS - 3+ VIEW

[[person_name] view (1 of 3)]
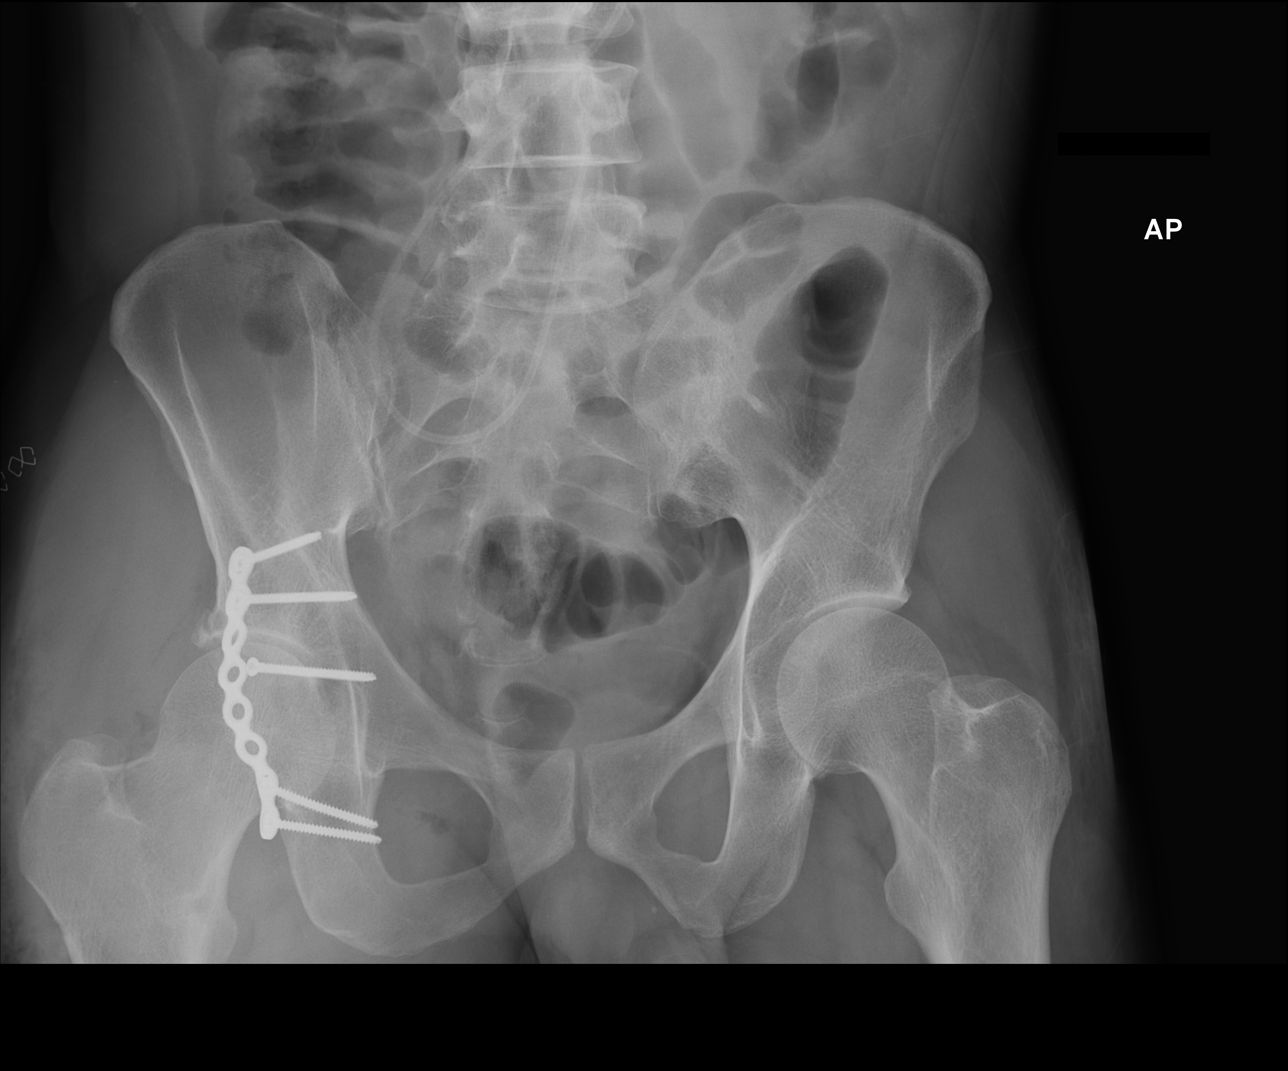

[[person_name] view (2 of 3)]
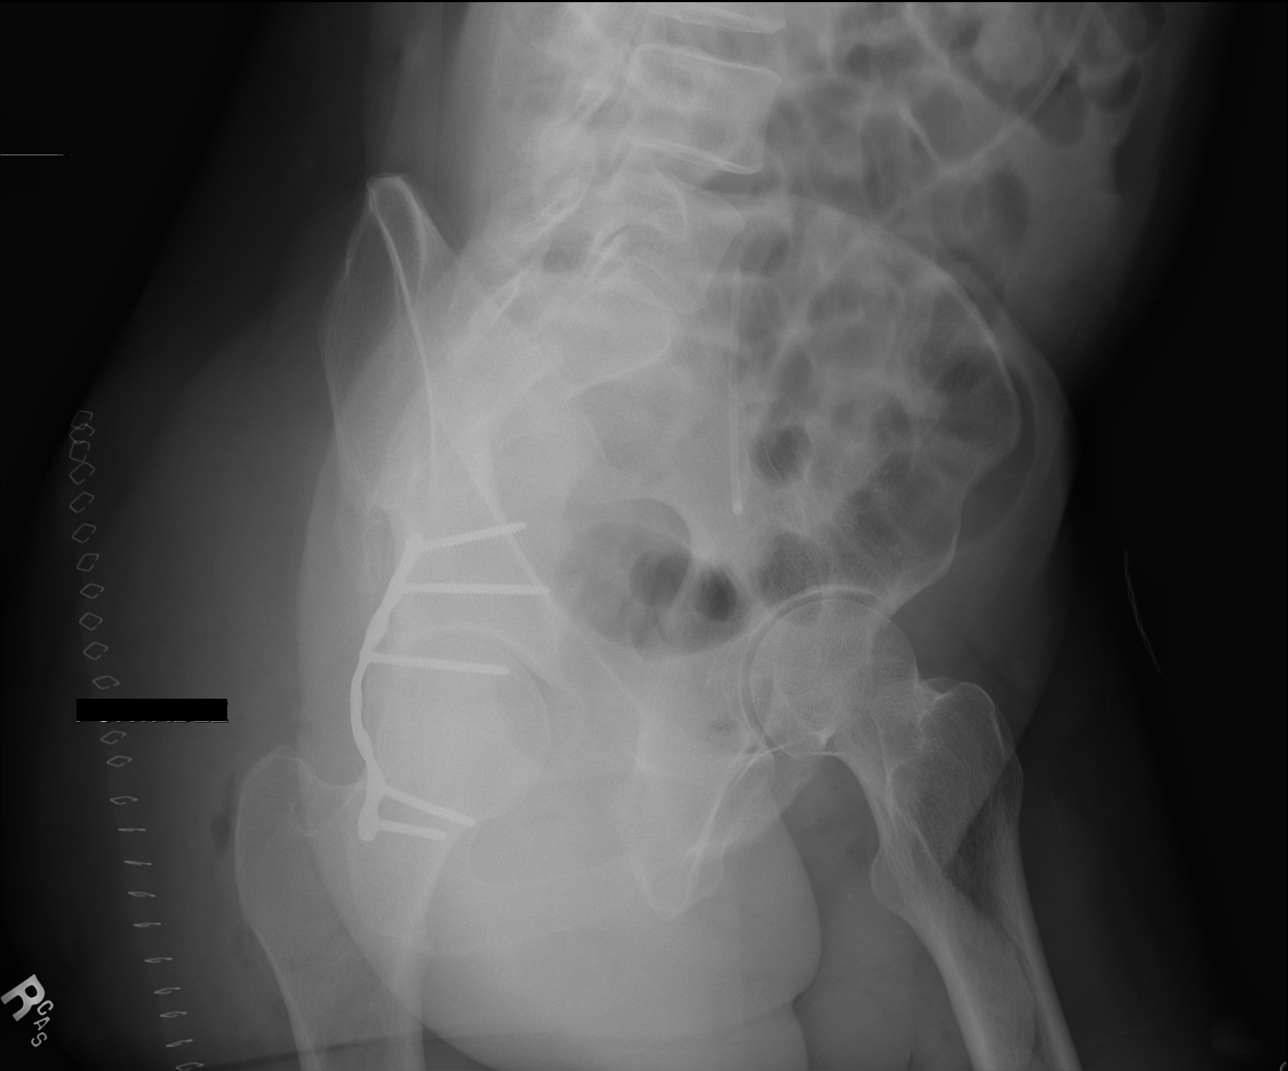

[[person_name] view (3 of 3)]
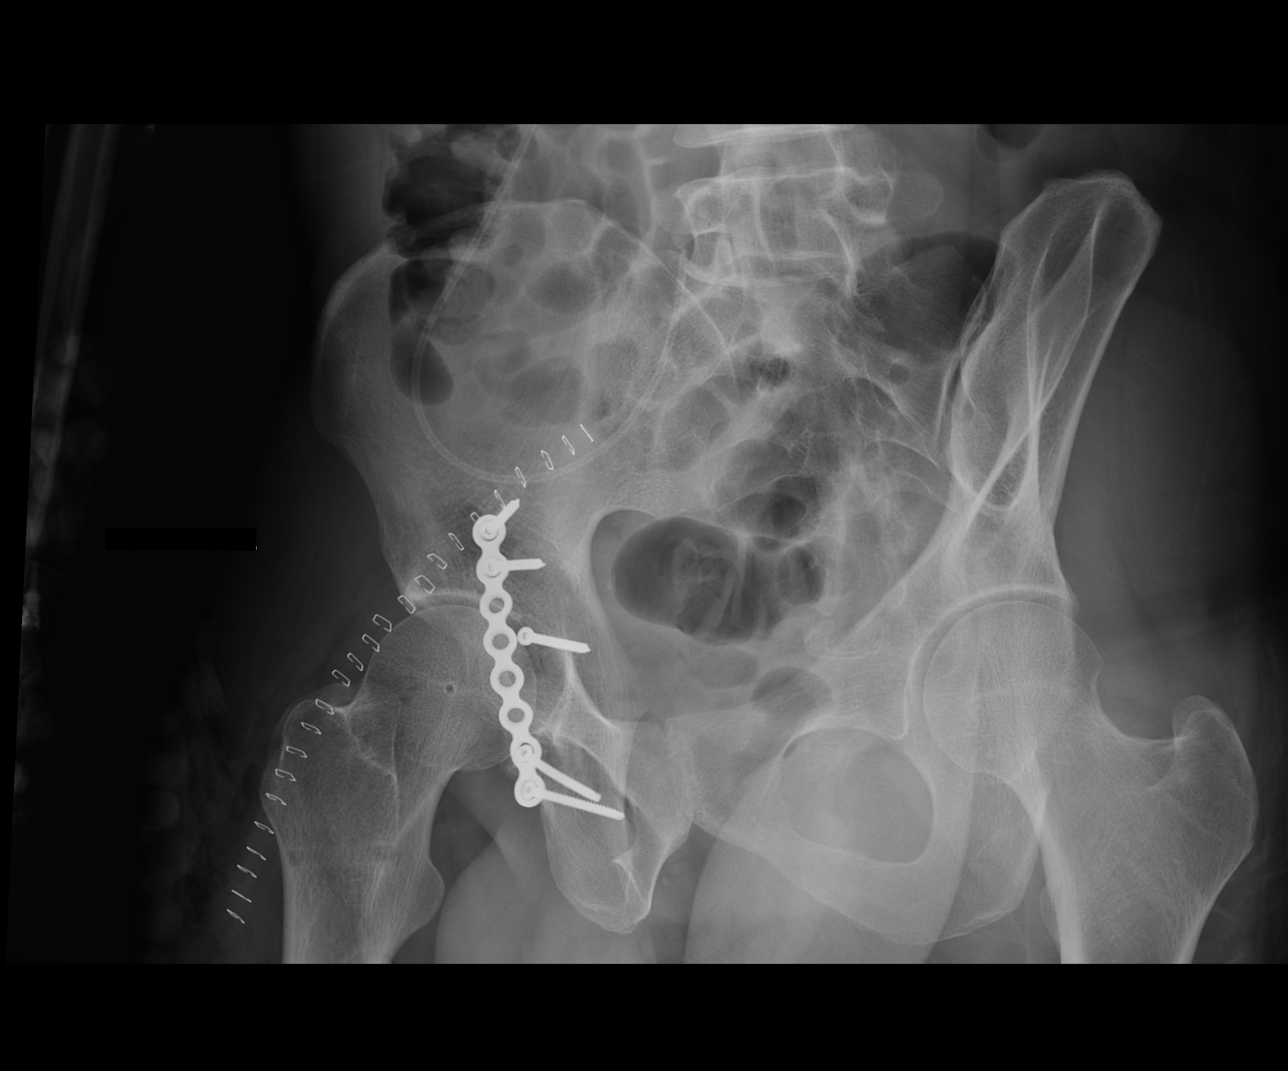

[3 of 3 positions shown; findings below may reference images not displayed]

FINDINGS: This postsurgical changes of open reduction and internal fixation of
the right acetabulum with fixation plate and screws along the
posterior acetabular wall. Small triangular corner fracture from the
superior lateral corner of the right acetabulum as seen on the prior
CT. No new fracture identified. The bones are well mineralized.
There is no dislocation. Small pockets of gas in the soft tissues of
the right hip and cutaneous surgical clips noted. A catheter is
partially visualized with tip in the mid abdomen.
IMPRESSION: Postsurgical changes of right acetabular fixation. No acute fracture
or dislocation identified.

## 2017-08-21 IMAGING — RF DG HIP (WITH PELVIS) OPERATIVE*R*
1 series · 5 of 5 positions shown · non-contrast
Comparison: 12/17/2015

CLINICAL DATA: Right acetabular fracture post fixation.

EXAM:
OPERATIVE right HIP (WITH PELVIS IF PERFORMED) 5 VIEWS
TECHNIQUE: Fluoroscopic spot image(s) were submitted for interpretation
post-operatively.

[Series 1: run · 5 of 5 slices shown]
[im 1/5]
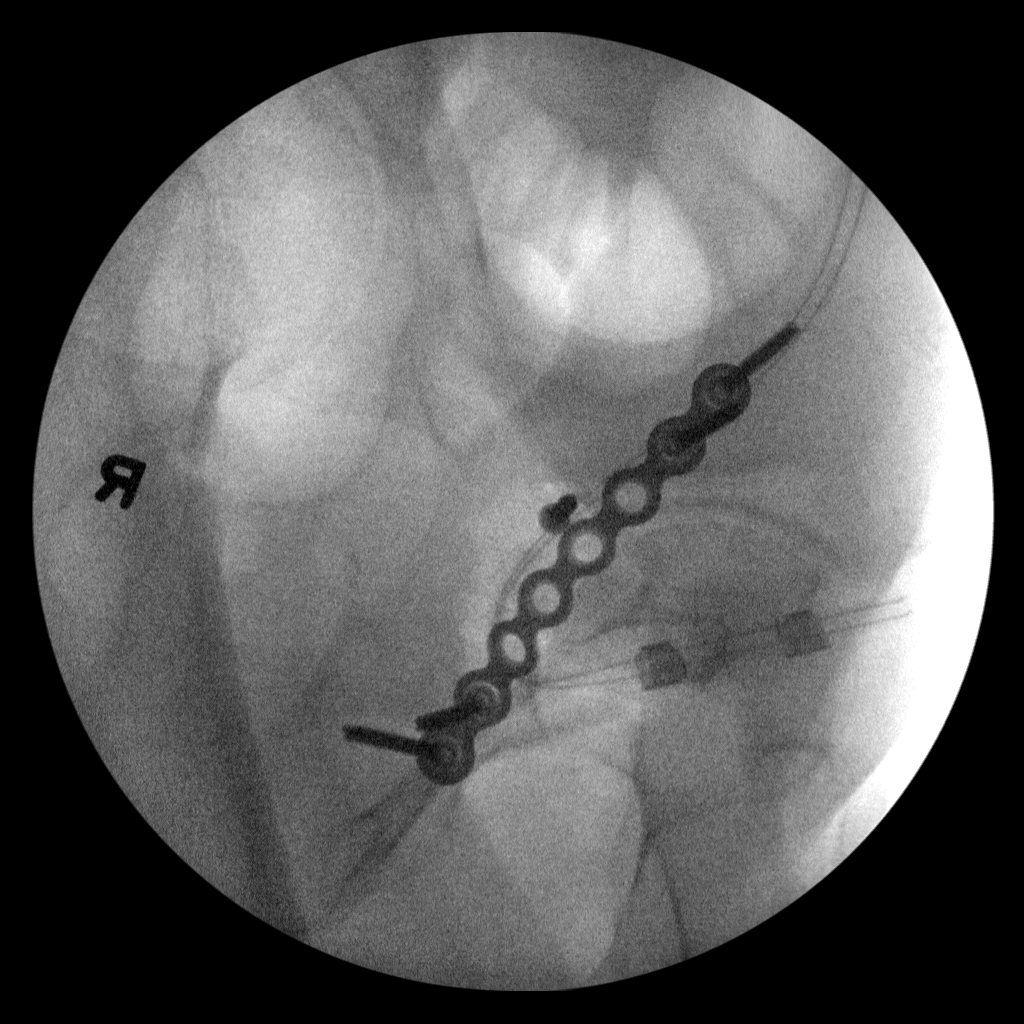
[im 2/5]
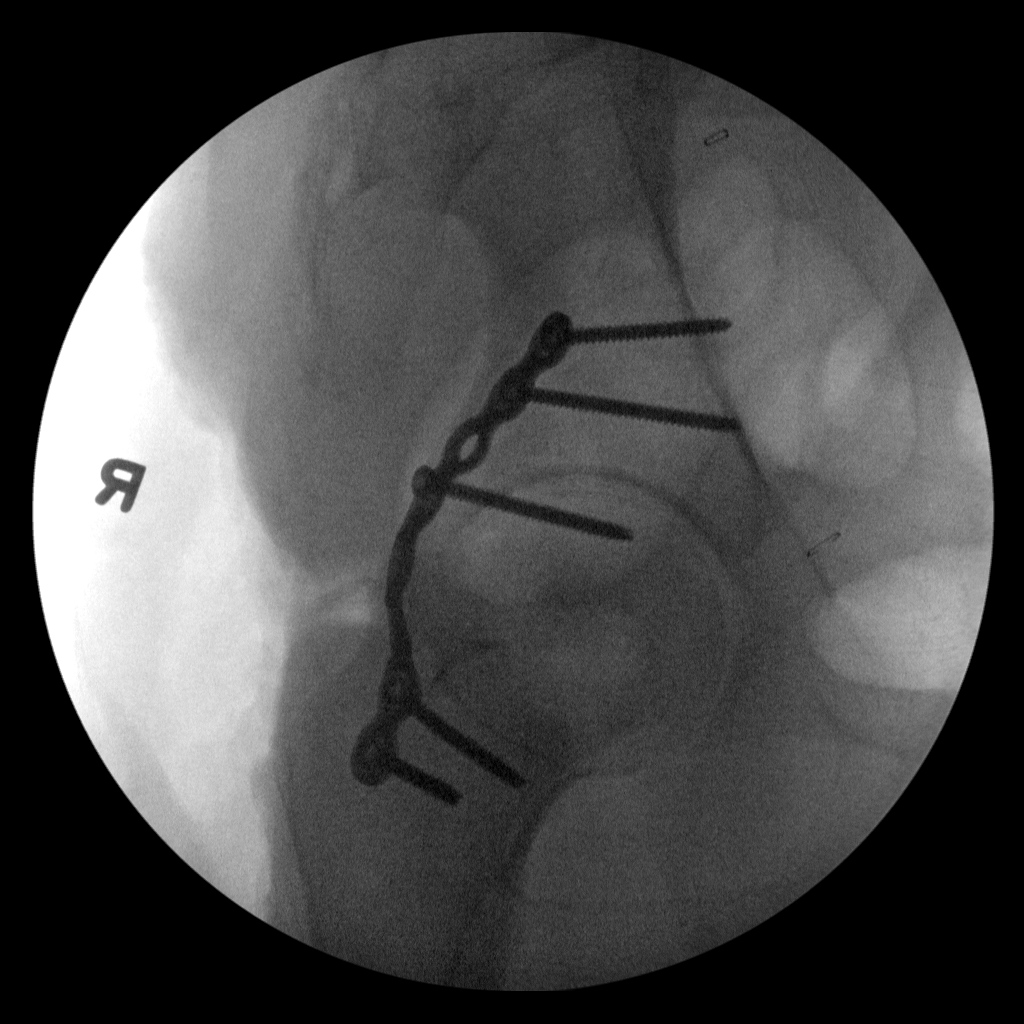
[im 3/5]
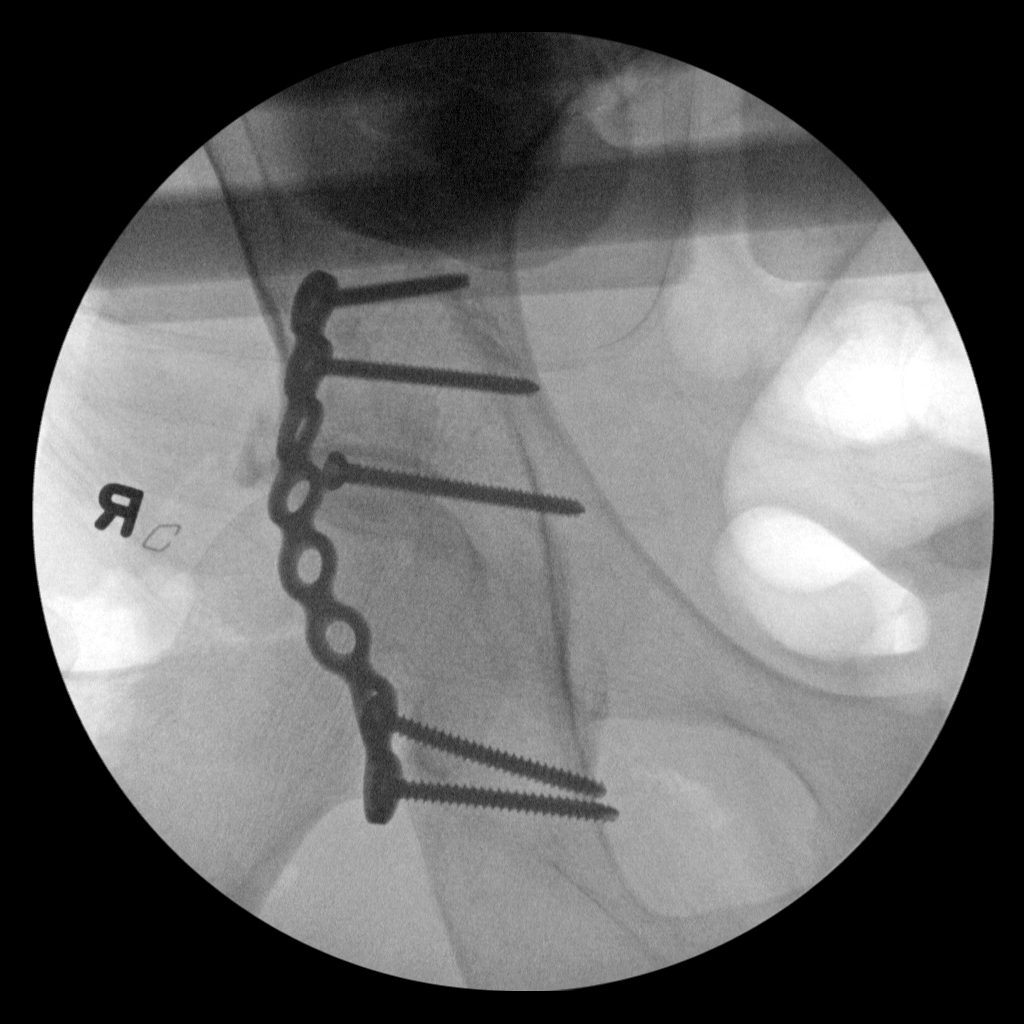
[im 4/5]
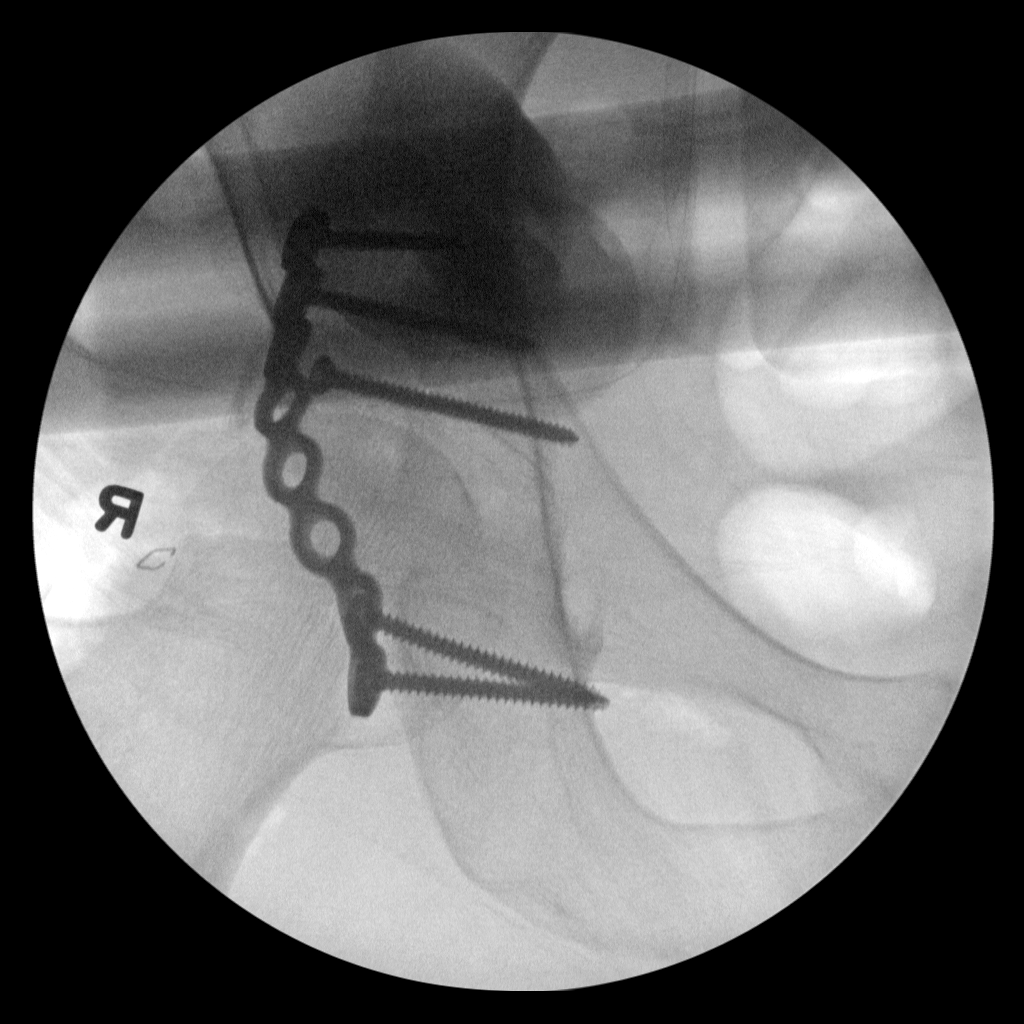
[im 5/5]
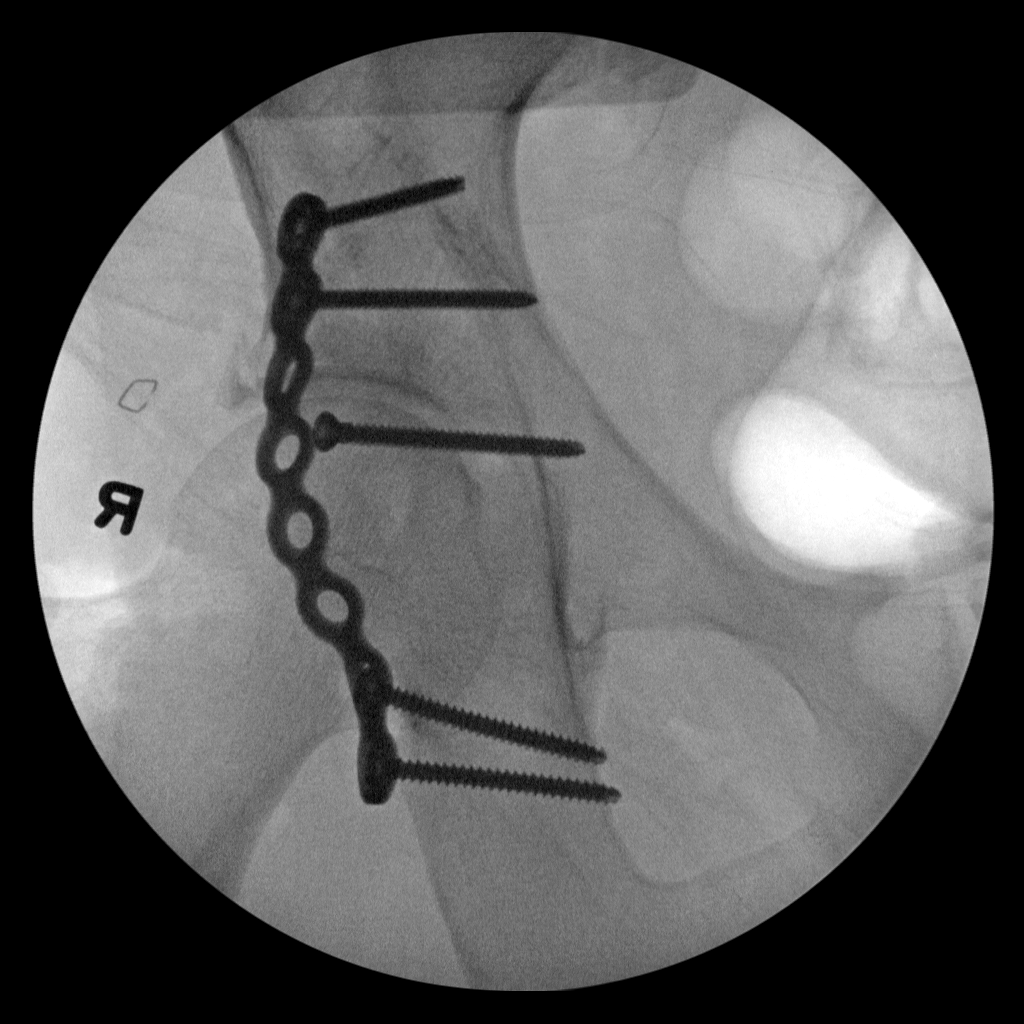

[5 of 5 positions shown; findings below may reference images not displayed]

FINDINGS: Examination demonstrates a fixation plate bridging patient's
superior lateral right acetabular fracture with hardware intact and
anatomic alignment about the fracture site. Note that the middle
screws not associated with the fixation plate. Recommend correlation
with findings at the time of the procedure.
IMPRESSION: Fixation of right acetabular fracture with hardware intact as
described.

## 2017-08-21 IMAGING — CR DG KNEE 1-2V PORT*R*
2 series · 2 of 2 positions shown · non-contrast
Comparison: CT dated 12/18/2015.

CLINICAL DATA: Medial and lateral tibial plateau fractures and
inferior patellar fracture.

EXAM:
PORTABLE RIGHT KNEE - 1-2 VIEW

[AP]
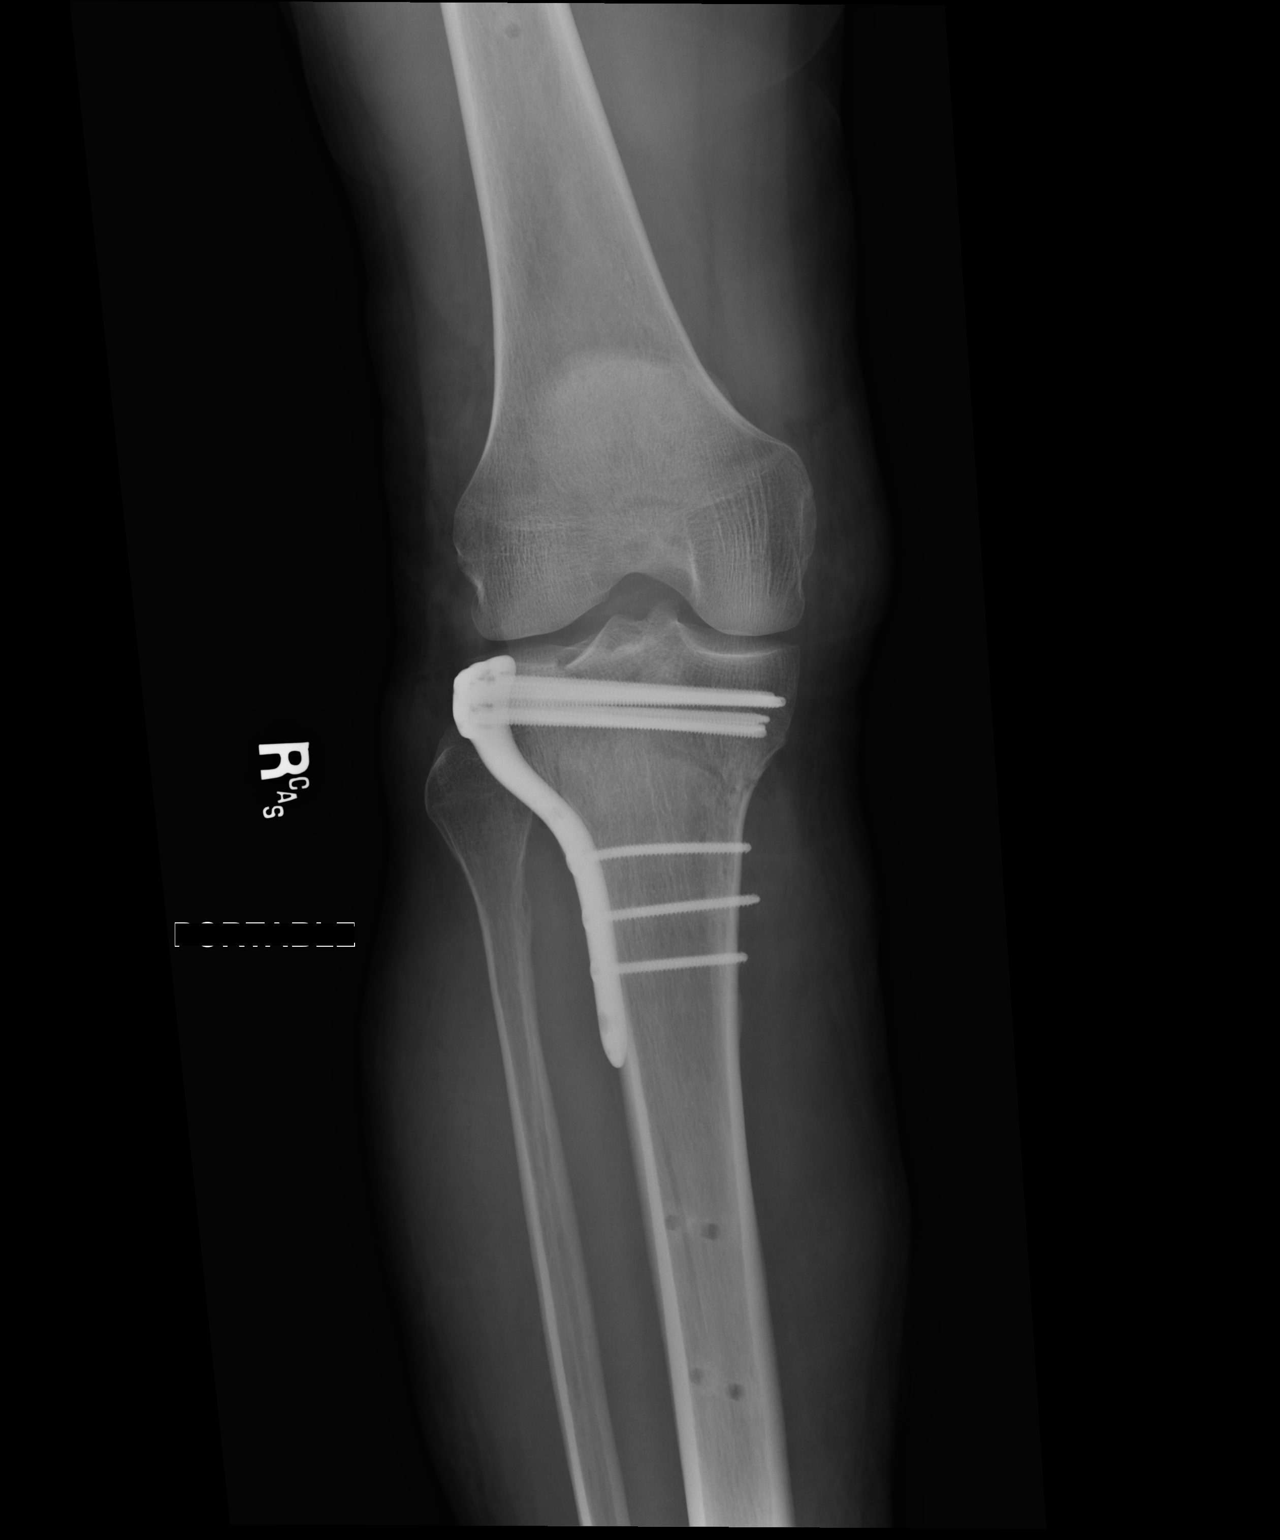

[xtable lateral]
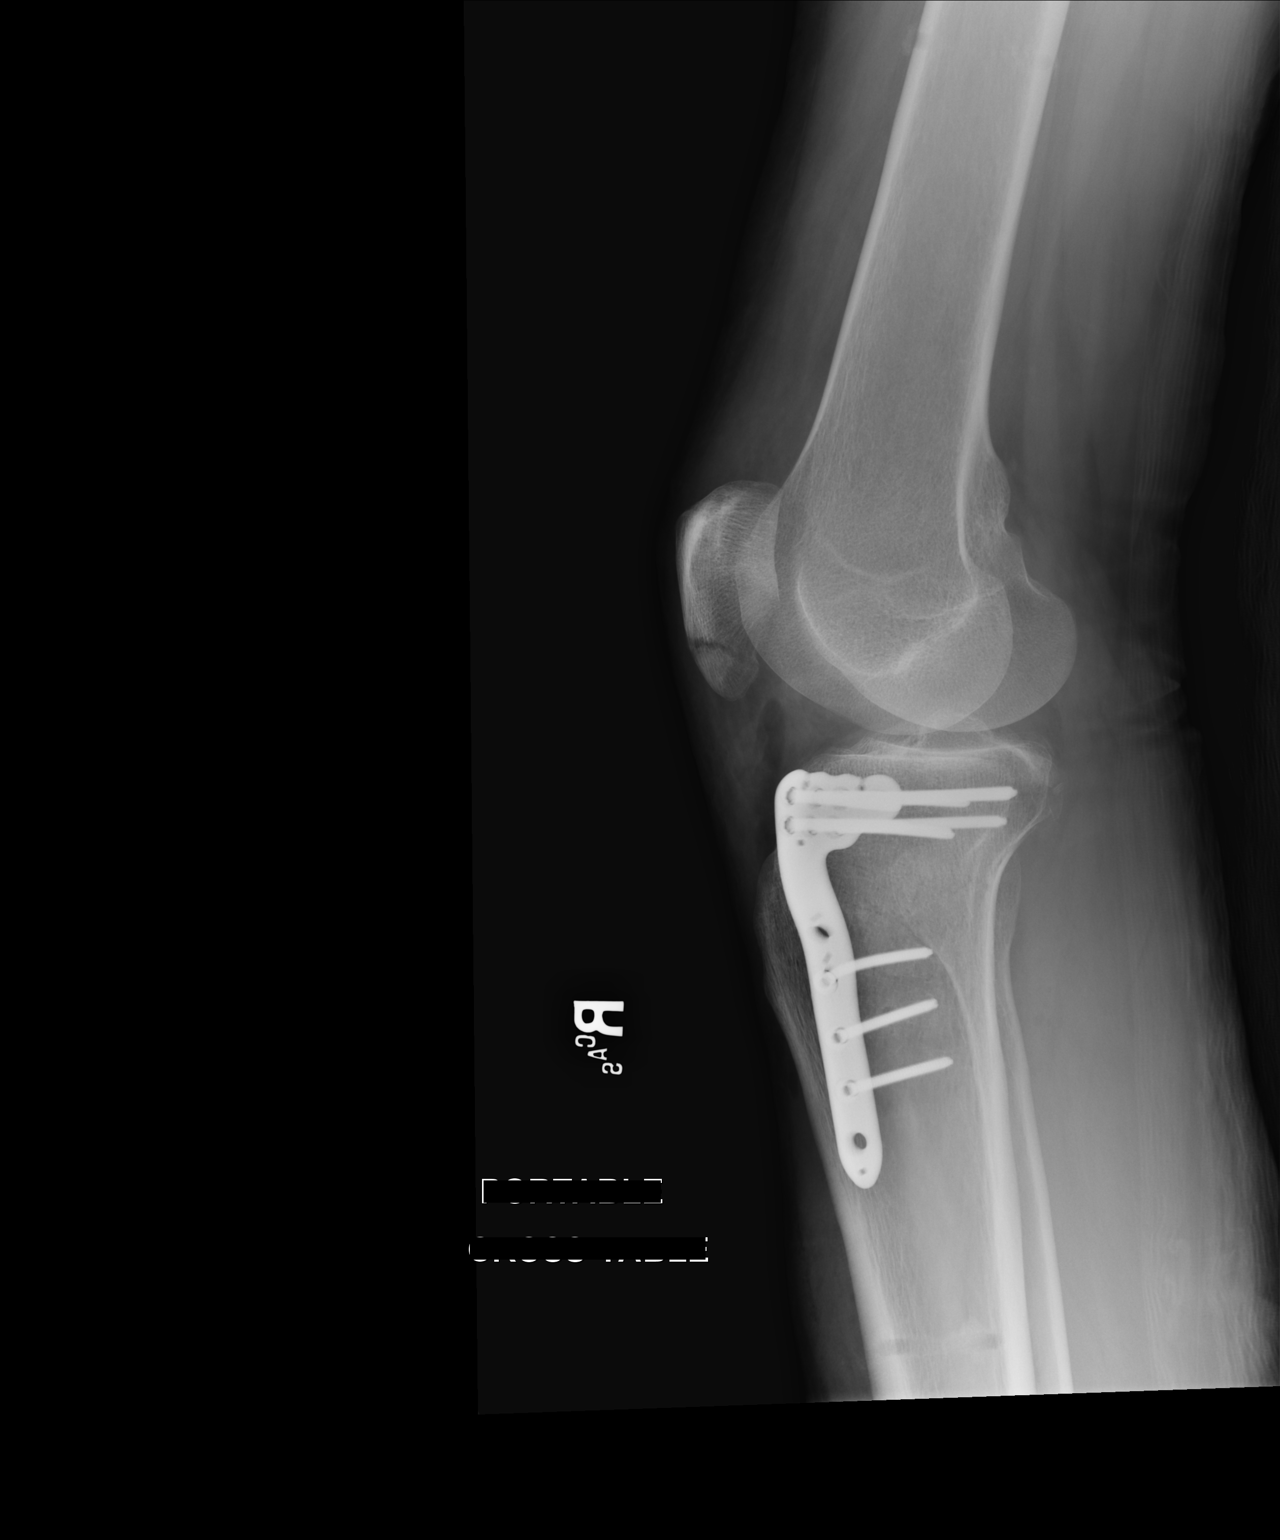

[2 of 2 positions shown; findings below may reference images not displayed]

FINDINGS: Interval screw and plate fixation of the previously described medial
and lateral tibial plateau fractures. Essentially anatomic position
and alignment of the fracture fragments. Essentially nondisplaced
transverse fracture through the inferior aspect of the patella.
IMPRESSION: 1. Hardware fixation of the previously described tibial plateau
fractures.
2. Stable patellar fracture.

## 2017-08-31 ENCOUNTER — Encounter
Payer: Worker's Compensation | Attending: Physical Medicine & Rehabilitation | Admitting: Physical Medicine & Rehabilitation

## 2017-08-31 ENCOUNTER — Encounter: Payer: Self-pay | Admitting: Physical Medicine & Rehabilitation

## 2017-08-31 VITALS — BP 121/83 | HR 80

## 2017-08-31 DIAGNOSIS — F419 Anxiety disorder, unspecified: Secondary | ICD-10-CM | POA: Insufficient documentation

## 2017-08-31 DIAGNOSIS — F09 Unspecified mental disorder due to known physiological condition: Secondary | ICD-10-CM

## 2017-08-31 DIAGNOSIS — S069X0S Unspecified intracranial injury without loss of consciousness, sequela: Secondary | ICD-10-CM | POA: Diagnosis not present

## 2017-08-31 DIAGNOSIS — G3189 Other specified degenerative diseases of nervous system: Secondary | ICD-10-CM | POA: Diagnosis not present

## 2017-08-31 DIAGNOSIS — G40909 Epilepsy, unspecified, not intractable, without status epilepticus: Secondary | ICD-10-CM | POA: Diagnosis not present

## 2017-08-31 DIAGNOSIS — S069XAS Unspecified intracranial injury with loss of consciousness status unknown, sequela: Secondary | ICD-10-CM

## 2017-08-31 DIAGNOSIS — Z87820 Personal history of traumatic brain injury: Secondary | ICD-10-CM | POA: Diagnosis not present

## 2017-08-31 DIAGNOSIS — S0572XS Avulsion of left eye, sequela: Secondary | ICD-10-CM | POA: Insufficient documentation

## 2017-08-31 DIAGNOSIS — F329 Major depressive disorder, single episode, unspecified: Secondary | ICD-10-CM | POA: Insufficient documentation

## 2017-08-31 DIAGNOSIS — Z09 Encounter for follow-up examination after completed treatment for conditions other than malignant neoplasm: Secondary | ICD-10-CM | POA: Insufficient documentation

## 2017-08-31 DIAGNOSIS — G4700 Insomnia, unspecified: Secondary | ICD-10-CM | POA: Diagnosis not present

## 2017-08-31 DIAGNOSIS — Z87891 Personal history of nicotine dependence: Secondary | ICD-10-CM | POA: Insufficient documentation

## 2017-08-31 DIAGNOSIS — J45909 Unspecified asthma, uncomplicated: Secondary | ICD-10-CM | POA: Insufficient documentation

## 2017-08-31 MED ORDER — LEVETIRACETAM ER 500 MG PO TB24: 1000.0000 mg | ORAL_TABLET | Freq: Every day | ORAL | 8 refills | Status: DC
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
## 2017-08-31 NOTE — Patient Instructions (Addendum)
PLEASE FEEL FREE TO CALL OUR OFFICE WITH ANY PROBLEMS OR QUESTIONS [phone])    KEEP AN ORGANIZER OR CALENDAR THAT YOU USE EVERY DAY!!!!

## 2018-05-01 ENCOUNTER — Ambulatory Visit: Payer: Self-pay | Admitting: Physical Medicine & Rehabilitation

## 2018-05-09 ENCOUNTER — Encounter
Payer: Worker's Compensation | Attending: Physical Medicine & Rehabilitation | Admitting: Physical Medicine & Rehabilitation

## 2018-06-19 ENCOUNTER — Other Ambulatory Visit: Payer: Self-pay | Admitting: Physical Medicine & Rehabilitation

## 2018-06-19 DIAGNOSIS — S069X0S Unspecified intracranial injury without loss of consciousness, sequela: Principal | ICD-10-CM

## 2018-06-19 DIAGNOSIS — G3189 Other specified degenerative diseases of nervous system: Principal | ICD-10-CM

## 2018-06-19 DIAGNOSIS — F09 Unspecified mental disorder due to known physiological condition: Secondary | ICD-10-CM

## 2018-06-20 ENCOUNTER — Encounter: Attending: Physical Medicine & Rehabilitation | Admitting: Physical Medicine & Rehabilitation

## 2018-06-20 ENCOUNTER — Encounter: Payer: Self-pay | Admitting: Physical Medicine & Rehabilitation

## 2018-06-20 ENCOUNTER — Other Ambulatory Visit: Payer: Self-pay

## 2018-06-20 ENCOUNTER — Encounter: Admitting: Physical Medicine & Rehabilitation

## 2018-06-20 VITALS — BP 114/77 | HR 81 | Ht 76.0 in | Wt 160.4 lb

## 2018-06-20 DIAGNOSIS — S069X0S Unspecified intracranial injury without loss of consciousness, sequela: Secondary | ICD-10-CM | POA: Diagnosis not present

## 2018-06-20 DIAGNOSIS — G40909 Epilepsy, unspecified, not intractable, without status epilepticus: Secondary | ICD-10-CM | POA: Diagnosis not present

## 2018-06-20 DIAGNOSIS — F09 Unspecified mental disorder due to known physiological condition: Secondary | ICD-10-CM | POA: Insufficient documentation

## 2018-06-20 DIAGNOSIS — G3189 Other specified degenerative diseases of nervous system: Secondary | ICD-10-CM | POA: Diagnosis not present

## 2018-06-20 MED ORDER — LEVETIRACETAM ER 500 MG PO TB24: 1000.0000 mg | ORAL_TABLET | Freq: Every day | ORAL | 8 refills | Status: DC

## 2018-06-20 NOTE — Patient Instructions (Addendum)
PLEASE FEEL FREE TO CALL OUR OFFICE WITH ANY PROBLEMS OR QUESTIONS [phone])   KEEP A REGULAR SCHEDULE/ORGANIZER
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
## 2019-03-19 ENCOUNTER — Other Ambulatory Visit: Payer: Self-pay | Admitting: Physical Medicine & Rehabilitation

## 2019-03-19 DIAGNOSIS — F09 Unspecified mental disorder due to known physiological condition: Secondary | ICD-10-CM

## 2019-06-20 ENCOUNTER — Encounter: Payer: Medicare Other | Admitting: Physical Medicine & Rehabilitation

## 2019-07-25 ENCOUNTER — Encounter: Payer: Self-pay | Admitting: Physical Medicine & Rehabilitation

## 2019-07-25 ENCOUNTER — Other Ambulatory Visit: Payer: Self-pay

## 2019-07-25 ENCOUNTER — Encounter: Attending: Physical Medicine & Rehabilitation | Admitting: Physical Medicine & Rehabilitation

## 2019-07-25 VITALS — BP 126/89 | HR 87 | Temp 97.7°F | Ht 76.0 in | Wt 158.0 lb

## 2019-07-25 DIAGNOSIS — G3189 Other specified degenerative diseases of nervous system: Secondary | ICD-10-CM | POA: Diagnosis not present

## 2019-07-25 DIAGNOSIS — G40909 Epilepsy, unspecified, not intractable, without status epilepticus: Secondary | ICD-10-CM | POA: Diagnosis not present

## 2019-07-25 DIAGNOSIS — S069X0S Unspecified intracranial injury without loss of consciousness, sequela: Secondary | ICD-10-CM | POA: Diagnosis present

## 2019-07-25 DIAGNOSIS — F09 Unspecified mental disorder due to known physiological condition: Secondary | ICD-10-CM

## 2019-07-25 NOTE — Patient Instructions (Signed)
REGULAR SLEEP, IMPROVED SLEEP HYGIENE. READ A BOOK, PUT ON SOME RELAXING MUSIC. TURN OFF LIGHTS. MAKE AN ENVIRONMENT FOR SLEEPING!
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
## 2019-12-06 ENCOUNTER — Other Ambulatory Visit: Payer: Self-pay | Admitting: Physical Medicine & Rehabilitation

## 2019-12-06 DIAGNOSIS — F09 Unspecified mental disorder due to known physiological condition: Secondary | ICD-10-CM

## 2019-12-06 DIAGNOSIS — G3189 Other specified degenerative diseases of nervous system: Secondary | ICD-10-CM

## 2019-12-12 DIAGNOSIS — Z20822 Contact with and (suspected) exposure to covid-19: Secondary | ICD-10-CM | POA: Diagnosis not present

## 2020-07-23 ENCOUNTER — Encounter: Payer: Medicare Other | Attending: Physical Medicine & Rehabilitation | Admitting: Physical Medicine & Rehabilitation

## 2020-08-23 ENCOUNTER — Other Ambulatory Visit: Payer: Self-pay | Admitting: Physical Medicine & Rehabilitation

## 2020-08-23 DIAGNOSIS — S069X9S Unspecified intracranial injury with loss of consciousness of unspecified duration, sequela: Secondary | ICD-10-CM

## 2020-08-23 DIAGNOSIS — F09 Unspecified mental disorder due to known physiological condition: Secondary | ICD-10-CM

## 2021-04-09 DIAGNOSIS — Z8781 Personal history of (healed) traumatic fracture: Secondary | ICD-10-CM | POA: Diagnosis not present

## 2021-04-09 DIAGNOSIS — H5442A3 Blindness left eye category 3, normal vision right eye: Secondary | ICD-10-CM | POA: Diagnosis not present

## 2021-04-09 DIAGNOSIS — Z0000 Encounter for general adult medical examination without abnormal findings: Secondary | ICD-10-CM | POA: Diagnosis not present

## 2021-04-09 DIAGNOSIS — Z87820 Personal history of traumatic brain injury: Secondary | ICD-10-CM | POA: Diagnosis not present

## 2021-04-09 DIAGNOSIS — J45909 Unspecified asthma, uncomplicated: Secondary | ICD-10-CM | POA: Diagnosis not present

## 2021-04-09 DIAGNOSIS — E785 Hyperlipidemia, unspecified: Secondary | ICD-10-CM | POA: Diagnosis not present

## 2021-05-16 ENCOUNTER — Other Ambulatory Visit: Payer: Self-pay | Admitting: Physical Medicine & Rehabilitation

## 2021-05-16 DIAGNOSIS — F09 Unspecified mental disorder due to known physiological condition: Secondary | ICD-10-CM

## 2021-05-16 DIAGNOSIS — S069XAS Unspecified intracranial injury with loss of consciousness status unknown, sequela: Secondary | ICD-10-CM

## 2021-05-20 ENCOUNTER — Encounter: Payer: Self-pay | Admitting: Physical Medicine & Rehabilitation

## 2021-05-20 ENCOUNTER — Other Ambulatory Visit: Payer: Self-pay

## 2021-05-20 ENCOUNTER — Encounter: Attending: Physical Medicine & Rehabilitation | Admitting: Physical Medicine & Rehabilitation

## 2021-05-20 VITALS — BP 121/83 | HR 62 | Temp 98.1°F | Ht 76.0 in | Wt 155.2 lb

## 2021-05-20 DIAGNOSIS — S069X5S Unspecified intracranial injury with loss of consciousness greater than 24 hours with return to pre-existing conscious level, sequela: Secondary | ICD-10-CM | POA: Diagnosis not present

## 2021-05-20 DIAGNOSIS — G40909 Epilepsy, unspecified, not intractable, without status epilepticus: Secondary | ICD-10-CM | POA: Insufficient documentation
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
## 2021-05-20 NOTE — Patient Instructions (Addendum)
EXERCISE, COOKING/MEALS, AND VOLUNTEER WORK:    SET SHORT TERM AND LONG TERM PLANS -have family take you to store every couple weeks -begin daily stretching, strength program -check out your local [employer] for work opportunity    SET SHORT TERM AND LONG TERM GOALS

## 2021-09-03 ENCOUNTER — Emergency Department (HOSPITAL_COMMUNITY): Payer: Medicare Other

## 2021-09-03 ENCOUNTER — Telehealth: Payer: Self-pay

## 2021-09-03 ENCOUNTER — Inpatient Hospital Stay (HOSPITAL_COMMUNITY)
Admission: EM | Admit: 2021-09-03 | Discharge: 2021-09-08 | DRG: 683 | Disposition: A | Discharge status: Home / Self Care | Payer: Medicare Other | Attending: Internal Medicine | Admitting: Internal Medicine

## 2021-09-03 DIAGNOSIS — J45909 Unspecified asthma, uncomplicated: Secondary | ICD-10-CM | POA: Diagnosis not present

## 2021-09-03 DIAGNOSIS — S069XAA Unspecified intracranial injury with loss of consciousness status unknown, initial encounter: Secondary | ICD-10-CM | POA: Diagnosis present

## 2021-09-03 DIAGNOSIS — G9340 Encephalopathy, unspecified: Secondary | ICD-10-CM | POA: Diagnosis not present

## 2021-09-03 DIAGNOSIS — Z79899 Other long term (current) drug therapy: Secondary | ICD-10-CM

## 2021-09-03 DIAGNOSIS — M952 Other acquired deformity of head: Secondary | ICD-10-CM | POA: Diagnosis present

## 2021-09-03 DIAGNOSIS — Z885 Allergy status to narcotic agent status: Secondary | ICD-10-CM | POA: Diagnosis not present

## 2021-09-03 DIAGNOSIS — G9389 Other specified disorders of brain: Secondary | ICD-10-CM | POA: Diagnosis not present

## 2021-09-03 DIAGNOSIS — Z982 Presence of cerebrospinal fluid drainage device: Secondary | ICD-10-CM | POA: Diagnosis not present

## 2021-09-03 DIAGNOSIS — R413 Other amnesia: Secondary | ICD-10-CM | POA: Diagnosis not present

## 2021-09-03 DIAGNOSIS — G939 Disorder of brain, unspecified: Secondary | ICD-10-CM | POA: Diagnosis not present

## 2021-09-03 DIAGNOSIS — T85618A Breakdown (mechanical) of other specified internal prosthetic devices, implants and grafts, initial encounter: Secondary | ICD-10-CM

## 2021-09-03 DIAGNOSIS — R569 Unspecified convulsions: Secondary | ICD-10-CM | POA: Diagnosis not present

## 2021-09-03 DIAGNOSIS — K759 Inflammatory liver disease, unspecified: Secondary | ICD-10-CM | POA: Diagnosis not present

## 2021-09-03 DIAGNOSIS — M6282 Rhabdomyolysis: Secondary | ICD-10-CM | POA: Diagnosis not present

## 2021-09-03 DIAGNOSIS — N179 Acute kidney failure, unspecified: Secondary | ICD-10-CM

## 2021-09-03 DIAGNOSIS — D72829 Elevated white blood cell count, unspecified: Secondary | ICD-10-CM | POA: Diagnosis present

## 2021-09-03 DIAGNOSIS — Z87891 Personal history of nicotine dependence: Secondary | ICD-10-CM | POA: Diagnosis not present

## 2021-09-03 DIAGNOSIS — R4182 Altered mental status, unspecified: Secondary | ICD-10-CM | POA: Diagnosis present

## 2021-09-03 DIAGNOSIS — S069XAS Unspecified intracranial injury with loss of consciousness status unknown, sequela: Secondary | ICD-10-CM

## 2021-09-03 DIAGNOSIS — G3184 Mild cognitive impairment, so stated: Secondary | ICD-10-CM

## 2021-09-03 DIAGNOSIS — K7689 Other specified diseases of liver: Secondary | ICD-10-CM | POA: Diagnosis not present

## 2021-09-03 DIAGNOSIS — R7401 Elevation of levels of liver transaminase levels: Secondary | ICD-10-CM | POA: Diagnosis present

## 2021-09-03 DIAGNOSIS — R220 Localized swelling, mass and lump, head: Secondary | ICD-10-CM | POA: Diagnosis not present

## 2021-09-03 DIAGNOSIS — G40909 Epilepsy, unspecified, not intractable, without status epilepticus: Secondary | ICD-10-CM

## 2021-09-03 DIAGNOSIS — Z20822 Contact with and (suspected) exposure to covid-19: Secondary | ICD-10-CM | POA: Diagnosis not present

## 2021-09-03 DIAGNOSIS — E876 Hypokalemia: Secondary | ICD-10-CM | POA: Diagnosis not present

## 2021-09-03 DIAGNOSIS — Z87820 Personal history of traumatic brain injury: Secondary | ICD-10-CM

## 2021-09-03 DIAGNOSIS — F09 Unspecified mental disorder due to known physiological condition: Secondary | ICD-10-CM

## 2021-09-03 DIAGNOSIS — N2889 Other specified disorders of kidney and ureter: Secondary | ICD-10-CM | POA: Diagnosis not present

## 2021-09-03 DIAGNOSIS — Z5181 Encounter for therapeutic drug level monitoring: Secondary | ICD-10-CM | POA: Diagnosis not present

## 2021-09-03 DIAGNOSIS — R531 Weakness: Secondary | ICD-10-CM | POA: Diagnosis not present

## 2021-09-03 LAB — CBC
HCT: 44 % (ref 39.0–52.0)
Hemoglobin: 15.1 g/dL (ref 13.0–17.0)
MCH: 30.4 pg (ref 26.0–34.0)
MCHC: 34.3 g/dL (ref 30.0–36.0)
MCV: 88.7 fL (ref 80.0–100.0)
Platelets: 210 10*3/uL (ref 150–400)
RBC: 4.96 MIL/uL (ref 4.22–5.81)
RDW: 13.3 % (ref 11.5–15.5)
WBC: 18.4 10*3/uL — ABNORMAL HIGH (ref 4.0–10.5)
nRBC: 0 % (ref 0.0–0.2)

## 2021-09-03 LAB — AMMONIA: Ammonia: 45 umol/L — ABNORMAL HIGH (ref 9–35)

## 2021-09-03 LAB — COMPREHENSIVE METABOLIC PANEL
ALT: 261 U/L — ABNORMAL HIGH (ref 0–44)
AST: 599 U/L — ABNORMAL HIGH (ref 15–41)
Albumin: 4.1 g/dL (ref 3.5–5.0)
Alkaline Phosphatase: 53 U/L (ref 38–126)
Anion gap: 15 (ref 5–15)
BUN: 48 mg/dL — ABNORMAL HIGH (ref 6–20)
CO2: 20 mmol/L — ABNORMAL LOW (ref 22–32)
Calcium: 9.1 mg/dL (ref 8.9–10.3)
Chloride: 99 mmol/L (ref 98–111)
Creatinine, Ser: 2.91 mg/dL — ABNORMAL HIGH (ref 0.61–1.24)
GFR, Estimated: 26 mL/min — ABNORMAL LOW (ref >60)
Glucose, Bld: 109 mg/dL — ABNORMAL HIGH (ref 70–99)
Potassium: 3.9 mmol/L (ref 3.5–5.1)
Sodium: 134 mmol/L — ABNORMAL LOW (ref 135–145)
Total Bilirubin: 2.9 mg/dL — ABNORMAL HIGH (ref 0.3–1.2)
Total Protein: 7.3 g/dL (ref 6.5–8.1)

## 2021-09-03 LAB — RESP PANEL BY RT-PCR (FLU A&B, COVID) ARPGX2
Influenza A by PCR: NEGATIVE
Influenza B by PCR: NEGATIVE
SARS Coronavirus 2 by RT PCR: NEGATIVE

## 2021-09-03 NOTE — ED Provider Notes (Signed)
Emergency Medicine Provider Triage Evaluation Note  Mark Chambers , a 45 y.o. male  was evaluated in triage.  Pt complains of mental status starting this morning.  Mother at bedside states that patient was found by his brother this morning without his shoes and unable to get into his house.  Mother is trying to contact brother to get more information.  They had called his primary care office, and they were advised to come to the emergency department.  Of note patient had a traumatic brain injury in 2005 with subsequent VP shunt placement, as well as seizure disorder normally controlled with Keppra.  Patient states he did not take yesterday's dose.  Review of Systems  Positive: Confusion, gait abnormality Negative: Headaches, blurred vision, chest pain, shortness of breath  Physical Exam  BP (!) 137/95 (BP Location: Left Arm)   Pulse 95   Temp 98.9 F (37.2 C) (Oral)   Resp 16   SpO2 98%  Gen:   Awake, no distress   Resp:  Normal effort  MSK:   Moves extremities without difficulty  Other:  Patient continuously repeats information and appears confused  Medical Decision Making  Medically screening exam initiated at 5:12 PM.  Appropriate orders placed.  [name] Whidbee was informed that the remainder of the evaluation will be completed by another provider, this initial triage assessment does not replace that evaluation, and the importance of remaining in the ED until their evaluation is complete.     Jeanella Flattery 09/03/21 1714    Terald Sleeper, MD 09/03/21 [phone]

## 2021-09-03 NOTE — Telephone Encounter (Signed)
Patient's mother called and stated patient has been lethargic and slurring his words since last night. She states he is hallucinating and seeing things that aren't there. She states he has his balance. She states he did miss his Keppra last night. I spoke with Suezanne Jacquet, RN and she advised that he goes to the ED. Informed patient's mother and she stated she was going to call for them to come pick him up.

## 2021-09-03 NOTE — ED Triage Notes (Signed)
Pt c/o "transient episode of confusion this AM," was found by brother without shoes, advised by RN on call from PCP to come to ED. Hx TBI, seizure disorder, compliant w keppra but did not take yesterday's dose.
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
## 2021-09-04 ENCOUNTER — Emergency Department (HOSPITAL_COMMUNITY): Payer: Medicare Other

## 2021-09-04 ENCOUNTER — Inpatient Hospital Stay (HOSPITAL_COMMUNITY): Payer: Medicare Other

## 2021-09-04 ENCOUNTER — Encounter (HOSPITAL_COMMUNITY): Payer: Self-pay | Admitting: Family Medicine

## 2021-09-04 ENCOUNTER — Other Ambulatory Visit: Payer: Self-pay

## 2021-09-04 DIAGNOSIS — R4182 Altered mental status, unspecified: Secondary | ICD-10-CM | POA: Diagnosis present

## 2021-09-04 DIAGNOSIS — K759 Inflammatory liver disease, unspecified: Secondary | ICD-10-CM | POA: Diagnosis present

## 2021-09-04 DIAGNOSIS — M952 Other acquired deformity of head: Secondary | ICD-10-CM | POA: Diagnosis present

## 2021-09-04 DIAGNOSIS — J45909 Unspecified asthma, uncomplicated: Secondary | ICD-10-CM | POA: Diagnosis present

## 2021-09-04 DIAGNOSIS — G9389 Other specified disorders of brain: Secondary | ICD-10-CM | POA: Diagnosis present

## 2021-09-04 DIAGNOSIS — D72829 Elevated white blood cell count, unspecified: Secondary | ICD-10-CM | POA: Diagnosis present

## 2021-09-04 DIAGNOSIS — R413 Other amnesia: Secondary | ICD-10-CM | POA: Diagnosis present

## 2021-09-04 DIAGNOSIS — Z87891 Personal history of nicotine dependence: Secondary | ICD-10-CM | POA: Diagnosis not present

## 2021-09-04 DIAGNOSIS — Z79899 Other long term (current) drug therapy: Secondary | ICD-10-CM | POA: Diagnosis not present

## 2021-09-04 DIAGNOSIS — G40909 Epilepsy, unspecified, not intractable, without status epilepticus: Secondary | ICD-10-CM

## 2021-09-04 DIAGNOSIS — N179 Acute kidney failure, unspecified: Principal | ICD-10-CM

## 2021-09-04 DIAGNOSIS — Z982 Presence of cerebrospinal fluid drainage device: Secondary | ICD-10-CM | POA: Diagnosis not present

## 2021-09-04 DIAGNOSIS — E876 Hypokalemia: Secondary | ICD-10-CM | POA: Diagnosis present

## 2021-09-04 DIAGNOSIS — Z885 Allergy status to narcotic agent status: Secondary | ICD-10-CM | POA: Diagnosis not present

## 2021-09-04 DIAGNOSIS — Z20822 Contact with and (suspected) exposure to covid-19: Secondary | ICD-10-CM | POA: Diagnosis present

## 2021-09-04 DIAGNOSIS — Z87820 Personal history of traumatic brain injury: Secondary | ICD-10-CM | POA: Diagnosis not present

## 2021-09-04 DIAGNOSIS — R7401 Elevation of levels of liver transaminase levels: Secondary | ICD-10-CM | POA: Diagnosis present

## 2021-09-04 DIAGNOSIS — M6282 Rhabdomyolysis: Secondary | ICD-10-CM | POA: Diagnosis present

## 2021-09-04 LAB — CBC
HCT: 44.1 % (ref 39.0–52.0)
Hemoglobin: 15 g/dL (ref 13.0–17.0)
MCH: 30.7 pg (ref 26.0–34.0)
MCHC: 34 g/dL (ref 30.0–36.0)
MCV: 90.2 fL (ref 80.0–100.0)
Platelets: 187 10*3/uL (ref 150–400)
RBC: 4.89 MIL/uL (ref 4.22–5.81)
RDW: 13.2 % (ref 11.5–15.5)
WBC: 13.2 10*3/uL — ABNORMAL HIGH (ref 4.0–10.5)
nRBC: 0 % (ref 0.0–0.2)

## 2021-09-04 LAB — BASIC METABOLIC PANEL WITH GFR
Anion gap: 15 (ref 5–15)
BUN: 45 mg/dL — ABNORMAL HIGH (ref 6–20)
CO2: 22 mmol/L (ref 22–32)
Calcium: 8.8 mg/dL — ABNORMAL LOW (ref 8.9–10.3)
Chloride: 101 mmol/L (ref 98–111)
Creatinine, Ser: 2.89 mg/dL — ABNORMAL HIGH (ref 0.61–1.24)
GFR, Estimated: 26 mL/min — ABNORMAL LOW
Glucose, Bld: 82 mg/dL (ref 70–99)
Potassium: 4.3 mmol/L (ref 3.5–5.1)
Sodium: 138 mmol/L (ref 135–145)

## 2021-09-04 LAB — HEPATITIS PANEL, ACUTE
HCV Ab: NONREACTIVE
Hep A IgM: NONREACTIVE
Hep B C IgM: NONREACTIVE
Hepatitis B Surface Ag: NONREACTIVE

## 2021-09-04 LAB — HEPATIC FUNCTION PANEL
ALT: 236 U/L — ABNORMAL HIGH (ref 0–44)
AST: 432 U/L — ABNORMAL HIGH (ref 15–41)
Albumin: 3.9 g/dL (ref 3.5–5.0)
Alkaline Phosphatase: 44 U/L (ref 38–126)
Bilirubin, Direct: 0.2 mg/dL (ref 0.0–0.2)
Indirect Bilirubin: 2.1 mg/dL — ABNORMAL HIGH (ref 0.3–0.9)
Total Bilirubin: 2.3 mg/dL — ABNORMAL HIGH (ref 0.3–1.2)
Total Protein: 6.7 g/dL (ref 6.5–8.1)

## 2021-09-04 LAB — CK: Total CK: 17865 U/L — ABNORMAL HIGH (ref 49–397)

## 2021-09-04 LAB — HIV ANTIBODY (ROUTINE TESTING W REFLEX): HIV Screen 4th Generation wRfx: NONREACTIVE

## 2021-09-04 LAB — LEVETIRACETAM LEVEL: Levetiracetam Lvl: 19.3 ug/mL (ref 10.0–40.0)

## 2021-09-04 LAB — PROCALCITONIN: Procalcitonin: 6.91 ng/mL

## 2021-09-04 MED ORDER — SODIUM CHLORIDE 0.9 % IV SOLN
2.0000 g | Freq: Once | INTRAVENOUS | Status: AC
  Administered 2021-09-04: 2 g via INTRAVENOUS
  Filled 2021-09-04: qty 20

## 2021-09-04 MED ORDER — LEVETIRACETAM ER 500 MG PO TB24
1000.0000 mg | ORAL_TABLET | Freq: Every day | ORAL | Status: DC
  Administered 2021-09-04: 1000 mg via ORAL
  Filled 2021-09-04: qty 2

## 2021-09-04 MED ORDER — SODIUM CHLORIDE 0.9 % IV SOLN: 2.0000 g | Freq: Two times a day (BID) | INTRAVENOUS | Status: DC

## 2021-09-04 MED ORDER — ACETAMINOPHEN 650 MG RE SUPP: 650.0000 mg | Freq: Four times a day (QID) | RECTAL | Status: DC | PRN

## 2021-09-04 MED ORDER — LACTATED RINGERS IV SOLN: INTRAVENOUS | Status: DC

## 2021-09-04 MED ORDER — GADOBUTROL 1 MMOL/ML IV SOLN
7.0000 mL | Freq: Once | INTRAVENOUS | Status: AC | PRN
  Administered 2021-09-04: 7 mL via INTRAVENOUS

## 2021-09-04 MED ORDER — SODIUM CHLORIDE 0.9 % IV BOLUS
1000.0000 mL | Freq: Once | INTRAVENOUS | Status: AC
  Administered 2021-09-04: 1000 mL via INTRAVENOUS

## 2021-09-04 MED ORDER — ALBUTEROL SULFATE (2.5 MG/3ML) 0.083% IN NEBU: 2.5000 mg | INHALATION_SOLUTION | RESPIRATORY_TRACT | Status: DC | PRN

## 2021-09-04 MED ORDER — LEVETIRACETAM IN NACL 500 MG/100ML IV SOLN
500.0000 mg | Freq: Two times a day (BID) | INTRAVENOUS | Status: DC
  Administered 2021-09-04 – 2021-09-08 (×8): 500 mg via INTRAVENOUS
  Filled 2021-09-04 (×8): qty 100

## 2021-09-04 MED ORDER — ONDANSETRON HCL 4 MG PO TABS: 4.0000 mg | ORAL_TABLET | Freq: Four times a day (QID) | ORAL | Status: DC | PRN

## 2021-09-04 MED ORDER — ACETAMINOPHEN 325 MG PO TABS: 650.0000 mg | ORAL_TABLET | Freq: Four times a day (QID) | ORAL | Status: DC | PRN

## 2021-09-04 MED ORDER — ONDANSETRON HCL 4 MG/2ML IJ SOLN: 4.0000 mg | Freq: Four times a day (QID) | INTRAMUSCULAR | Status: DC | PRN

## 2021-09-04 NOTE — ED Notes (Signed)
MRI called to transfer images to [facility], including CT images.

## 2021-09-04 NOTE — Progress Notes (Signed)
EEG complete - results pending 
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
## 2021-09-04 NOTE — Progress Notes (Signed)
Patient ID: Mark Chambers, male   DOB: 1976-06-11, 45 y.o.   MRN: [id number] BP 126/81   Pulse 72   Temp 98.5 F (36.9 C) (Oral)   Resp 15   Ht 6\' 4"  (1.93 m)   Wt 70.4 kg   SpO2 96%   BMI 18.89 kg/m  Films reviewed. Will pass needle into the fluid collection, send for cx, cells

## 2021-09-04 NOTE — Consult Note (Signed)
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 normal.        Behavior: Behavior normal.    Assessment/Plan: Mark Chambers is a 45 y.o. 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 The mucocele is not an acute problem, nor life threatening. This can wait until next week.   Coletta Memos 09/04/2021, 7:09 PM

## 2021-09-04 NOTE — ED Notes (Signed)
Dinner ordered 

## 2021-09-04 NOTE — ED Provider Notes (Addendum)
I received the patient in signout from Dr. Wilkie Aye, briefly the patient is a 45 year old male with a chief complaint of altered mental status.  Found to have changes on his CT scan from many years ago.  Now has a frontal mass with some mass-effect.  Radiology was concerned that this may have caused his symptoms.  Also has an AKI.  Plan for MRI of the brain possible discussion with neurosurgery and admission.  MRI of the brain with possible mucocele with some possible mass-effect.  I discussed the case with Dr. Franky Macho.  He plans to come and evaluate the patient at bedside.  Initially recommending no acute neurosurgical intervention.  Recommended medical admission.        Melene Plan, DO 09/04/21 [phone]

## 2021-09-04 NOTE — Procedures (Addendum)
ROUTINE EEG REPORT DATE OF STUDY:  09/04/2021 from 14:48 to 15:14  HISTORY: 45yo man with history of prior severe TBI, post traumatic seizures presents with confusion.  DESCRIPTION: During maximal wakefulness, the background was well organized and continuous, consisting of an admixture of fast frequencies (alpha, beta and theta) ranging between 10-50 uV.  There was a posterior dominant alpha rhythm at 8 Hz, which was symmetric and reactive to eye opening and closure. Low amplitude, 13-18 Hz beta rhythms were seen symmetrically over the fronto-central head regions. Spontaneous variability and reactivity to stimulation were present.  Artifact obscured much of the F8 electrode throughout the recording. Stage I (N1) sleep was recorded, with alpha dropout and slow roving eye movements. There were occasional very brief bursts of intermittent slowing seen independently over the left hemisphere and right frontotemporal region.  These also appeared to be generalized at times. No epileptiform discharges were recorded. No clinical or electrographic seizures were recorded.   Hyperventilation and photic stimulation were not performed.  CLASSIFICATION (EEG IMPRESSION): Abnormal significance II (awake, drowsy) Intermittent slow, lateralized, left Intermittent slow, regional, right frontotemporal Intermittent slow, generalized  CLINICAL INTERPRETATION: Multifocal mild to moderate focal cerebral dysfunction was seen likely related to patient's history of severe TBI, frontally and right temporally predominant.  There is also evidence of a mild diffuse encephalopathy that is non-specific and can be seen with postictal state or toxic metabolic derangements (eg infection).  There was no evidence to support a diagnosis of seizures on this recording.  Rane Dumm A. Senaida Ores, MD Neurology and Clinical Neurophysiology

## 2021-09-05 LAB — CBC
HCT: 39 % (ref 39.0–52.0)
Hemoglobin: 13.1 g/dL (ref 13.0–17.0)
MCH: 29.8 pg (ref 26.0–34.0)
MCHC: 33.6 g/dL (ref 30.0–36.0)
MCV: 88.8 fL (ref 80.0–100.0)
Platelets: 180 10*3/uL (ref 150–400)
RBC: 4.39 MIL/uL (ref 4.22–5.81)
RDW: 12.9 % (ref 11.5–15.5)
WBC: 8.7 10*3/uL (ref 4.0–10.5)
nRBC: 0 % (ref 0.0–0.2)

## 2021-09-05 LAB — COMPREHENSIVE METABOLIC PANEL
ALT: 195 U/L — ABNORMAL HIGH (ref 0–44)
AST: 295 U/L — ABNORMAL HIGH (ref 15–41)
Albumin: 3.2 g/dL — ABNORMAL LOW (ref 3.5–5.0)
Alkaline Phosphatase: 35 U/L — ABNORMAL LOW (ref 38–126)
Anion gap: 10 (ref 5–15)
BUN: 36 mg/dL — ABNORMAL HIGH (ref 6–20)
CO2: 23 mmol/L (ref 22–32)
Calcium: 8.6 mg/dL — ABNORMAL LOW (ref 8.9–10.3)
Chloride: 101 mmol/L (ref 98–111)
Creatinine, Ser: 2.41 mg/dL — ABNORMAL HIGH (ref 0.61–1.24)
GFR, Estimated: 33 mL/min — ABNORMAL LOW (ref >60)
Glucose, Bld: 86 mg/dL (ref 70–99)
Potassium: 3.8 mmol/L (ref 3.5–5.1)
Sodium: 134 mmol/L — ABNORMAL LOW (ref 135–145)
Total Bilirubin: 1.8 mg/dL — ABNORMAL HIGH (ref 0.3–1.2)
Total Protein: 5.9 g/dL — ABNORMAL LOW (ref 6.5–8.1)

## 2021-09-05 LAB — CK: Total CK: 9558 U/L — ABNORMAL HIGH (ref 49–397)

## 2021-09-05 LAB — PROCALCITONIN: Procalcitonin: 3.99 ng/mL

## 2021-09-05 MED ORDER — LOPERAMIDE HCL 2 MG PO CAPS
4.0000 mg | ORAL_CAPSULE | Freq: Three times a day (TID) | ORAL | Status: DC | PRN
  Administered 2021-09-05: 4 mg via ORAL
  Filled 2021-09-05: qty 2

## 2021-09-05 NOTE — Plan of Care (Signed)
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
## 2021-09-06 LAB — COMPREHENSIVE METABOLIC PANEL
ALT: 169 U/L — ABNORMAL HIGH (ref 0–44)
AST: 186 U/L — ABNORMAL HIGH (ref 15–41)
Albumin: 3.2 g/dL — ABNORMAL LOW (ref 3.5–5.0)
Alkaline Phosphatase: 36 U/L — ABNORMAL LOW (ref 38–126)
Anion gap: 11 (ref 5–15)
BUN: 24 mg/dL — ABNORMAL HIGH (ref 6–20)
CO2: 24 mmol/L (ref 22–32)
Calcium: 8.9 mg/dL (ref 8.9–10.3)
Chloride: 100 mmol/L (ref 98–111)
Creatinine, Ser: 1.89 mg/dL — ABNORMAL HIGH (ref 0.61–1.24)
GFR, Estimated: 44 mL/min — ABNORMAL LOW (ref >60)
Glucose, Bld: 90 mg/dL (ref 70–99)
Potassium: 4 mmol/L (ref 3.5–5.1)
Sodium: 135 mmol/L (ref 135–145)
Total Bilirubin: 1.9 mg/dL — ABNORMAL HIGH (ref 0.3–1.2)
Total Protein: 5.8 g/dL — ABNORMAL LOW (ref 6.5–8.1)

## 2021-09-06 LAB — BRAIN NATRIURETIC PEPTIDE: B Natriuretic Peptide: 38.3 pg/mL (ref 0.0–100.0)

## 2021-09-06 LAB — CBC WITH DIFFERENTIAL/PLATELET
Abs Immature Granulocytes: 0.02 10*3/uL (ref 0.00–0.07)
Basophils Absolute: 0 10*3/uL (ref 0.0–0.1)
Basophils Relative: 0 %
Eosinophils Absolute: 0.2 10*3/uL (ref 0.0–0.5)
Eosinophils Relative: 3 %
HCT: 39.3 % (ref 39.0–52.0)
Hemoglobin: 13.3 g/dL (ref 13.0–17.0)
Immature Granulocytes: 0 %
Lymphocytes Relative: 15 %
Lymphs Abs: 1.1 10*3/uL (ref 0.7–4.0)
MCH: 29.9 pg (ref 26.0–34.0)
MCHC: 33.8 g/dL (ref 30.0–36.0)
MCV: 88.3 fL (ref 80.0–100.0)
Monocytes Absolute: 0.6 10*3/uL (ref 0.1–1.0)
Monocytes Relative: 8 %
Neutro Abs: 5.4 10*3/uL (ref 1.7–7.7)
Neutrophils Relative %: 74 %
Platelets: 140 10*3/uL — ABNORMAL LOW (ref 150–400)
RBC: 4.45 MIL/uL (ref 4.22–5.81)
RDW: 12.5 % (ref 11.5–15.5)
WBC: 7.3 10*3/uL (ref 4.0–10.5)
nRBC: 0 % (ref 0.0–0.2)

## 2021-09-06 LAB — PROCALCITONIN: Procalcitonin: 1.78 ng/mL

## 2021-09-06 LAB — MAGNESIUM: Magnesium: 1.6 mg/dL — ABNORMAL LOW (ref 1.7–2.4)

## 2021-09-06 LAB — C-REACTIVE PROTEIN: CRP: 0.6 mg/dL (ref <1.0)

## 2021-09-06 LAB — CK: Total CK: 2582 U/L — ABNORMAL HIGH (ref 49–397)

## 2021-09-06 MED ORDER — MAGNESIUM SULFATE 2 GM/50ML IV SOLN
2.0000 g | Freq: Once | INTRAVENOUS | Status: AC
  Administered 2021-09-06: 2 g via INTRAVENOUS
  Filled 2021-09-06: qty 50

## 2021-09-06 MED ORDER — LACTATED RINGERS IV SOLN: INTRAVENOUS | Status: AC

## 2021-09-06 NOTE — Plan of Care (Signed)
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
## 2021-09-07 LAB — CBC WITH DIFFERENTIAL/PLATELET
Abs Immature Granulocytes: 0.03 10*3/uL (ref 0.00–0.07)
Basophils Absolute: 0 10*3/uL (ref 0.0–0.1)
Basophils Relative: 0 %
Eosinophils Absolute: 0.3 10*3/uL (ref 0.0–0.5)
Eosinophils Relative: 4 %
HCT: 38.5 % — ABNORMAL LOW (ref 39.0–52.0)
Hemoglobin: 13.3 g/dL (ref 13.0–17.0)
Immature Granulocytes: 0 %
Lymphocytes Relative: 14 %
Lymphs Abs: 1.2 10*3/uL (ref 0.7–4.0)
MCH: 30.5 pg (ref 26.0–34.0)
MCHC: 34.5 g/dL (ref 30.0–36.0)
MCV: 88.3 fL (ref 80.0–100.0)
Monocytes Absolute: 0.7 10*3/uL (ref 0.1–1.0)
Monocytes Relative: 8 %
Neutro Abs: 6.5 10*3/uL (ref 1.7–7.7)
Neutrophils Relative %: 74 %
Platelets: 162 10*3/uL (ref 150–400)
RBC: 4.36 MIL/uL (ref 4.22–5.81)
RDW: 12.5 % (ref 11.5–15.5)
WBC: 8.7 10*3/uL (ref 4.0–10.5)
nRBC: 0 % (ref 0.0–0.2)

## 2021-09-07 LAB — CK: Total CK: 1519 U/L — ABNORMAL HIGH (ref 49–397)

## 2021-09-07 LAB — COMPREHENSIVE METABOLIC PANEL
ALT: 144 U/L — ABNORMAL HIGH (ref 0–44)
AST: 100 U/L — ABNORMAL HIGH (ref 15–41)
Albumin: 3.5 g/dL (ref 3.5–5.0)
Alkaline Phosphatase: 33 U/L — ABNORMAL LOW (ref 38–126)
Anion gap: 10 (ref 5–15)
BUN: 13 mg/dL (ref 6–20)
CO2: 28 mmol/L (ref 22–32)
Calcium: 8.9 mg/dL (ref 8.9–10.3)
Chloride: 99 mmol/L (ref 98–111)
Creatinine, Ser: 1.61 mg/dL — ABNORMAL HIGH (ref 0.61–1.24)
GFR, Estimated: 53 mL/min — ABNORMAL LOW (ref >60)
Glucose, Bld: 100 mg/dL — ABNORMAL HIGH (ref 70–99)
Potassium: 3.4 mmol/L — ABNORMAL LOW (ref 3.5–5.1)
Sodium: 137 mmol/L (ref 135–145)
Total Bilirubin: 1.4 mg/dL — ABNORMAL HIGH (ref 0.3–1.2)
Total Protein: 6.2 g/dL — ABNORMAL LOW (ref 6.5–8.1)

## 2021-09-07 LAB — PROCALCITONIN: Procalcitonin: 0.73 ng/mL

## 2021-09-07 LAB — BRAIN NATRIURETIC PEPTIDE: B Natriuretic Peptide: 32.4 pg/mL (ref 0.0–100.0)

## 2021-09-07 LAB — C-REACTIVE PROTEIN: CRP: <0.5 mg/dL (ref <1.0)

## 2021-09-07 LAB — MAGNESIUM: Magnesium: 1.6 mg/dL — ABNORMAL LOW (ref 1.7–2.4)

## 2021-09-07 LAB — TYPE AND SCREEN
ABO/RH(D): A POS
Antibody Screen: NEGATIVE

## 2021-09-07 LAB — ABO/RH: ABO/RH(D): A POS

## 2021-09-07 MED ORDER — MAGNESIUM SULFATE 4 GM/100ML IV SOLN
4.0000 g | Freq: Once | INTRAVENOUS | Status: AC
  Administered 2021-09-07: 4 g via INTRAVENOUS
  Filled 2021-09-07: qty 100

## 2021-09-07 MED ORDER — PHENYLEPHRINE HCL-NACL 20-0.9 MG/250ML-% IV SOLN
INTRAVENOUS | Status: AC
  Filled 2021-09-07: qty 500

## 2021-09-07 MED ORDER — POTASSIUM CHLORIDE CRYS ER 20 MEQ PO TBCR
20.0000 meq | EXTENDED_RELEASE_TABLET | Freq: Once | ORAL | Status: AC
  Administered 2021-09-07: 20 meq via ORAL
  Filled 2021-09-07: qty 1

## 2021-09-07 MED ORDER — POTASSIUM CHLORIDE CRYS ER 20 MEQ PO TBCR
40.0000 meq | EXTENDED_RELEASE_TABLET | Freq: Once | ORAL | Status: AC
  Administered 2021-09-07: 40 meq via ORAL
  Filled 2021-09-07: qty 2
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
## 2021-09-07 NOTE — Care Management Important Message (Signed)
Important Message  Patient Details  Name: Mark Chambers MRN: [id number] Date of Birth: 03-11-1976   Medicare Important Message Given:  Yes     Randen Kauth Stefan Church 09/07/2021, 3:29 PM
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
## 2021-09-08 DIAGNOSIS — Z79899 Other long term (current) drug therapy: Secondary | ICD-10-CM | POA: Diagnosis not present

## 2021-09-08 DIAGNOSIS — Z87820 Personal history of traumatic brain injury: Secondary | ICD-10-CM | POA: Diagnosis not present

## 2021-09-08 DIAGNOSIS — R569 Unspecified convulsions: Secondary | ICD-10-CM | POA: Diagnosis not present

## 2021-09-08 DIAGNOSIS — Z5181 Encounter for therapeutic drug level monitoring: Secondary | ICD-10-CM | POA: Diagnosis not present

## 2021-09-08 DIAGNOSIS — G9389 Other specified disorders of brain: Secondary | ICD-10-CM | POA: Diagnosis not present

## 2021-09-08 DIAGNOSIS — Z982 Presence of cerebrospinal fluid drainage device: Secondary | ICD-10-CM | POA: Diagnosis not present

## 2021-09-08 LAB — COMPREHENSIVE METABOLIC PANEL
ALT: 109 U/L — ABNORMAL HIGH (ref 0–44)
AST: 54 U/L — ABNORMAL HIGH (ref 15–41)
Albumin: 3.3 g/dL — ABNORMAL LOW (ref 3.5–5.0)
Alkaline Phosphatase: 33 U/L — ABNORMAL LOW (ref 38–126)
Anion gap: 10 (ref 5–15)
BUN: 8 mg/dL (ref 6–20)
CO2: 26 mmol/L (ref 22–32)
Calcium: 8.9 mg/dL (ref 8.9–10.3)
Chloride: 102 mmol/L (ref 98–111)
Creatinine, Ser: 1.54 mg/dL — ABNORMAL HIGH (ref 0.61–1.24)
GFR, Estimated: 56 mL/min — ABNORMAL LOW (ref >60)
Glucose, Bld: 86 mg/dL (ref 70–99)
Potassium: 4.3 mmol/L (ref 3.5–5.1)
Sodium: 138 mmol/L (ref 135–145)
Total Bilirubin: 1.2 mg/dL (ref 0.3–1.2)
Total Protein: 6 g/dL — ABNORMAL LOW (ref 6.5–8.1)

## 2021-09-08 LAB — CBC WITH DIFFERENTIAL/PLATELET
Abs Immature Granulocytes: 0.03 10*3/uL (ref 0.00–0.07)
Basophils Absolute: 0 10*3/uL (ref 0.0–0.1)
Basophils Relative: 0 %
Eosinophils Absolute: 0.4 10*3/uL (ref 0.0–0.5)
Eosinophils Relative: 4 %
HCT: 39.1 % (ref 39.0–52.0)
Hemoglobin: 13 g/dL (ref 13.0–17.0)
Immature Granulocytes: 0 %
Lymphocytes Relative: 17 %
Lymphs Abs: 1.6 10*3/uL (ref 0.7–4.0)
MCH: 30.1 pg (ref 26.0–34.0)
MCHC: 33.2 g/dL (ref 30.0–36.0)
MCV: 90.5 fL (ref 80.0–100.0)
Monocytes Absolute: 0.7 10*3/uL (ref 0.1–1.0)
Monocytes Relative: 8 %
Neutro Abs: 6.7 10*3/uL (ref 1.7–7.7)
Neutrophils Relative %: 71 %
Platelets: 180 10*3/uL (ref 150–400)
RBC: 4.32 MIL/uL (ref 4.22–5.81)
RDW: 12.5 % (ref 11.5–15.5)
WBC: 9.5 10*3/uL (ref 4.0–10.5)
nRBC: 0 % (ref 0.0–0.2)

## 2021-09-08 LAB — PROCALCITONIN: Procalcitonin: 0.3 ng/mL

## 2021-09-08 LAB — MAGNESIUM: Magnesium: 1.8 mg/dL (ref 1.7–2.4)

## 2021-09-08 LAB — CK: Total CK: 723 U/L — ABNORMAL HIGH (ref 49–397)

## 2021-09-08 LAB — C-REACTIVE PROTEIN: CRP: <0.5 mg/dL (ref <1.0)

## 2021-09-08 MED ORDER — MAGNESIUM SULFATE 2 GM/50ML IV SOLN
2.0000 g | Freq: Once | INTRAVENOUS | Status: AC
  Administered 2021-09-08: 2 g via INTRAVENOUS
  Filled 2021-09-08: qty 50
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
## 2021-09-08 NOTE — Plan of Care (Signed)
  Problem: Education: Goal: Knowledge of General Education information will improve Description: Including pain rating scale, medication(s)/side effects and non-pharmacologic comfort measures Outcome: Adequate for Discharge   Problem: Health Behavior/Discharge Planning: Goal: Ability to manage health-related needs will improve Outcome: Adequate for Discharge   Problem: Clinical Measurements: Goal: Ability to maintain clinical measurements within normal limits will improve Outcome: Adequate for Discharge Goal: Will remain free from infection Outcome: Adequate for Discharge Goal: Diagnostic test results will improve Outcome: Adequate for Discharge Goal: Respiratory complications will improve Outcome: Adequate for Discharge Goal: Cardiovascular complication will be avoided Outcome: Adequate for Discharge   Problem: Activity: Goal: Risk for activity intolerance will decrease Outcome: Adequate for Discharge   Problem: Elimination: Goal: Will not experience complications related to bowel motility Outcome: Adequate for Discharge Goal: Will not experience complications related to urinary retention Outcome: Adequate for Discharge   Problem: Activity: Goal: Risk for activity intolerance will decrease Outcome: Adequate for Discharge   Problem: Skin Integrity: Goal: Risk for impaired skin integrity will decrease Outcome: Adequate for Discharge
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
## 2021-09-08 NOTE — Discharge Instructions (Signed)
Do not drive, operate heavy machinery, perform activities at heights, swimming or participation in water activities or provide baby sitting services until you have seen by Primary MD or a Neurologist and advised to do so again.  Follow with Primary MD Elias Else, MD in 7 days   Get CBC, CMP, 2 view Chest X ray -  checked next visit within 1 week by Primary MD   Activity: As tolerated with Full fall precautions use walker/cane & assistance as needed  Disposition Home    Diet: Heart Healthy    Special Instructions: If you have smoked or chewed Tobacco  in the last 2 yrs please stop smoking, stop any regular Alcohol  and or any Recreational drug use.  On your next visit with your primary care physician please Get Medicines reviewed and adjusted.  Please request your Prim.MD to go over all Hospital Tests and Procedure/Radiological results at the follow up, please get all Hospital records sent to your Prim MD by signing hospital release before you go home.  If you experience worsening of your admission symptoms, develop shortness of breath, life threatening emergency, suicidal or homicidal thoughts you must seek medical attention immediately by calling 911 or calling your MD immediately  if symptoms less severe.  You Must read complete instructions/literature along with all the possible adverse reactions/side effects for all the Medicines you take and that have been prescribed to you. Take any new Medicines after you have completely understood and accpet all the possible adverse reactions/side effects.

## 2021-09-08 NOTE — Progress Notes (Signed)
[name] Sandate D/C'd Home per MD order.  Discussed with the patient and all questions fully answered.  VSS, Skin clean, dry and intact without evidence of skin break down, no evidence of skin tears noted. IV catheter discontinued intact. Site without signs and symptoms of complications. Dressing and pressure applied.  An After Visit Summary was printed and given to the patient. Patient received prescription.  D/c education completed with patient/mom including follow up instructions, medication list, d/c activities limitations if indicated, with other d/c instructions as indicated by MD - patient able to verbalize understanding, all questions fully answered.   Patient instructed to return to ED, call 911, or call MD for any changes in condition.   Patient escorted via WC, and D/C home via private auto.  Melvenia Needles 09/08/2021 1:44 PM
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
## 2021-09-09 DIAGNOSIS — R569 Unspecified convulsions: Secondary | ICD-10-CM | POA: Diagnosis not present

## 2021-09-09 DIAGNOSIS — Z982 Presence of cerebrospinal fluid drainage device: Secondary | ICD-10-CM | POA: Diagnosis not present

## 2021-09-09 LAB — CULTURE, BLOOD (ROUTINE X 2)
Culture: NO GROWTH
Culture: NO GROWTH
Special Requests: ADEQUATE
Special Requests: ADEQUATE

## 2021-09-29 DIAGNOSIS — F411 Generalized anxiety disorder: Secondary | ICD-10-CM | POA: Insufficient documentation

## 2021-09-29 DIAGNOSIS — F32A Depression, unspecified: Secondary | ICD-10-CM | POA: Insufficient documentation

## 2021-10-01 DIAGNOSIS — J321 Chronic frontal sinusitis: Secondary | ICD-10-CM | POA: Diagnosis not present

## 2021-10-01 DIAGNOSIS — T8131XA Disruption of external operation (surgical) wound, not elsewhere classified, initial encounter: Secondary | ICD-10-CM | POA: Diagnosis not present

## 2021-10-01 DIAGNOSIS — J341 Cyst and mucocele of nose and nasal sinus: Secondary | ICD-10-CM | POA: Diagnosis not present

## 2021-10-01 DIAGNOSIS — T847XXA Infection and inflammatory reaction due to other internal orthopedic prosthetic devices, implants and grafts, initial encounter: Secondary | ICD-10-CM | POA: Diagnosis not present

## 2021-10-01 HISTORY — PX: BRAIN SURGERY: SHX531

## 2021-10-02 DIAGNOSIS — J341 Cyst and mucocele of nose and nasal sinus: Secondary | ICD-10-CM | POA: Diagnosis not present

## 2021-10-02 DIAGNOSIS — M8668 Other chronic osteomyelitis, other site: Secondary | ICD-10-CM | POA: Diagnosis not present

## 2021-10-02 DIAGNOSIS — J321 Chronic frontal sinusitis: Secondary | ICD-10-CM | POA: Diagnosis not present

## 2021-10-05 DIAGNOSIS — M8668 Other chronic osteomyelitis, other site: Secondary | ICD-10-CM | POA: Diagnosis not present

## 2021-10-05 DIAGNOSIS — J321 Chronic frontal sinusitis: Secondary | ICD-10-CM | POA: Diagnosis not present

## 2021-10-05 DIAGNOSIS — J341 Cyst and mucocele of nose and nasal sinus: Secondary | ICD-10-CM | POA: Diagnosis not present

## 2021-10-06 NOTE — Discharge Summary (Signed)
 Discharge Summary   Patient Name: Mark Chambers Patient MRN: [id number] Date of Birth: 03/29/1976    Facility:[facility]  Attending Physician: Cuff Alm Pride, MD   Date of Admission: 10/01/2021  Date of Discharge: 10/09/2021    Admission Diagnosis: Chronic frontal sinusitis [J32.1]  Principal Discharge Diagnosis: Chronic frontal sinusitis [J32.1]  Consult Orders: IP CONSULT TO INFECTIOUS DISEASES IP CONSULT TO RESOURCE CENTER IP CONSULT TO RESOURCE CENTER   Secondary Diagnoses:  Patient Active Problem List  Diagnosis  . Central scotoma  . Myopia with astigmatism  . Clinical anophthalmos, left eye - Left Eye  . Fibrovascular macular scar of right eye  . Anophthalmos  . Myopia of right eye with astigmatism and presbyopia  . H/O traumatic brain injury  . H/O enucleation of left eyeball  . Visual impairment of right eye  . Seizure disorder (CMS-HCC)  . Mild cognitive impairment  . Mild intermittent asthma  . Mucocele of frontal sinus  . GAD (generalized anxiety disorder)  . Depression  . Chronic pain of right lower extremity  . Chronic frontal sinusitis    Surgeries Performed: Procedure(s): **ICT**- STEREOTACTIC COMPUTER-ASSISTED (NAVIGATIONAL) PROCEDURE; CRANIAL, INTRAOPERATIVE COMPUTED TOMOGRAPHY SCAN (LIST IN ADDITION TO PRIMARY PROCEDURE) CRANIECTOMY; FOR OSTEOMYELITIS SINUSOTOMY FRONTAL; OBLITERATIVE WITHOUT OSTEOPLASTIC FLAP, BROW INCISION (INCLUDES ABLATION) CRANIOPLASTY FOR SKULL DEFECT, >5 CM REMOVAL OF IMPLANT; FACE, DEEP (INFECTED POROUS POLYETHYLENE PLATE AND 8 SCREWS) ADJACENT TISSUE TRANSFER OR REARRANGEMENT, SCALP; DEFECT 10.1 SQ CM TO 30.0 SQ CM SURGICAL WOUND PREP (INCL SUBQ TISSUE), SCALP/HEAD/NECK; FIRST 100 SQ CM  Allergies: Allergies  Allergen Reactions  . Morphine Nausea and Vomiting      Discharge Physical Exam:  Temp (24hrs), Avg:36.8 C (98.3 F), Min:36.6 C (97.8 F), Max:37.2 C (98.9 F)  Temp:  [36.6 C (97.8  F)-37.2 C (98.9 F)] 36.6 C (97.8 F) Heart Rate:  [61-79] 79 Resp:  [14-18] 14 BP: (103-125)/(56-86) 107/56 General: awake, alert and oriented, conversing appropriately Face: frontal incision contiguous with well-healed prior scar is c/d/i with sutures in place, no palpable collection and no drainage Respiratory: unlabored breathing on room air  Abdomen: soft, non-distended  Extremities: no edema Drains: not present   Brief Hospital Course: The patient was taken to the operating room on 10/01/2021, and underwent a mucocele resection and calvarial reconstruction without difficulty. The patient tolerated the procedure well, was extubated, and taken to the recovery room in stable condition.   The post-operative course was as expected without significant complications. Laboratory studies remained within normal limits. The patient was begun on a regular diet which was well tolerated without significant nausea or vomiting. The patient was able to void spontaneously. The patient was able to ambulate without difficulty. The infectious disease team was consulted on POD1 and followed the patient while culture data resulted. Given the reported history and operative findings, they recommended 6 weeks of IV antibiotics. A PICC line was placed on 10/06/21 and our case management team worked with the patient and family in order to set up safe discharge with family assistance and nursing assistance for IV antibiotic delivery and daily administration with weekly labs. The patient was found to meet all expected discharge criteria, and was discharged on 10/09/2021   Discharge Disposition:  Home with home health: Home health infusion   Medications at Discharge:   Discharge Medications    New Medications     Details  acetaminophen  325 MG tablet Commonly known as: TYLENOL   650 mg, Oral, Every 4 hours PRN Quantity: 30 tablet Refills:  0   melatonin 3 mg tablet  3 mg, Oral, Nightly PRN Quantity: 14  tablet Refills: 0   metroNIDAZOLE 500 MG tablet Commonly known as: FLAGYL  500 mg, Oral, Every 8 hours Quantity: 90 tablet Refills: 1   polyethylene glycol powder Commonly known as: MIRALAX   Take 1 capful (17 g total) by mouth once daily for 14 days. Mix in 4-8 ounces of fluid prior to taking. Quantity: 238 g Refills: 0      Medications To Continue     Details  KEPPRA  500 MG tablet Generic drug: levETIRAcetam   1 tablet, 2 times Daily Refills: 0   multivitamin capsule  1 capsule, Oral, Daily Refills: 0   PROBIOTIC ORAL  1 tablet, Oral, Daily Refills: 0        Patient Instructions:  Do not soak in a tub or swim for 4 weeks. Follow shower/bathing recommendations included in your discharge instructions  Do not lift anything over 10 lbs or do any repetitive bending or squatting for 3 weeks. Do not engage in contact sports for 3 weeks  Please continue to walk multiple times a day. This helps to decrease your risk of blood clots forming Do not drive while taking pain medication. Please be sure to continue wound care as outlined in your discharge summary Please empty and record your drain output three times a day.  If we have changed your medications, please see your primary care physician within 10 days of discharge to discuss these medication changes. Please follow the remainder of the specific discharge instructions included in your AVS  If you have any questions or concerns DURING BUSINESS HOURS (9am-4pm) PLEASE CALL [phone].  Outside of business hours or weekends PLEASE CONTACT PLASTIC SURGERY RESIDENT ON CALL AT [phone] IF: Temperature greater than 100.4 Persistent nausea and vomiting Severe uncontrolled pain Changes in color of the breast flap (severe redness, or severe paleness) Redness, tenderness, or signs of infection (pain, swelling, redness, odor or green/yellow discharge around the site) Difficulty breathing, headache or visual  disturbances Hives Persistent dizziness or light-headedness Extreme fatigue Any other questions or concerns you may have after discharge  In an emergency, call [phone]   It is important to bring a complete, current list of your medications to any medical appointments or hospitalizations.  Follow-up Recommended:  Please keep your follow up appointments as scheduled below    [facility] Appointments: Future Appointments  Date Time Provider Department Center  10/20/2021  3:00 PM CLINIC, [facility]  11/11/2021  2:30 PM Zavala Monzon, Flint Boyer, MD INFECT DIS [facility]     Primary Care Physician Gib Lamar Marsa Mickey. Address: [address] [place] [facility] [place] [address]  Phone: [phone] Fax: [phone]    Outside Providers, for pending tests please use the following [facility] numbers:  Laboratory - Please Call: [phone] Microbiology - Please Call: [phone] Pathology - Please Call: [phone]  Outside Providers, with questions related to your patient's hospitalization, please contact the attending of record, Powers, Alm Pride, MD.  Signed,  VICTORIA ANN EARVIN, MD [facility] 10/09/2021  Time spent on this discharge: 30 minutes        ------------------------------------------------------------------------------- Attestation signed by Heasley Alm Pride, MD at 10/11/2021 10:25 PM Staff Addendum:  Dr. EARVIN was performing under my supervision/direction, and I independently examined the patient.  I was immediately available for assistance throughout the treatment.  I personally reviewed all pertinent radiographic and laboratory findings for this  patient and concur with the assessment, documentation and treatment plan as noted above. The total time spent on this patient encounter was 30 minutes, with that time spent in direct patient evaluation, discussion of radiographic and/or  laboratory and clinical findings, presentation of treatment options, and completion of required documentation in the electronic medical records.  -------------------------------------------------------------------------------

## 2021-10-19 DIAGNOSIS — J341 Cyst and mucocele of nose and nasal sinus: Secondary | ICD-10-CM | POA: Diagnosis not present

## 2021-10-27 DIAGNOSIS — J341 Cyst and mucocele of nose and nasal sinus: Secondary | ICD-10-CM | POA: Diagnosis not present

## 2021-11-03 DIAGNOSIS — J341 Cyst and mucocele of nose and nasal sinus: Secondary | ICD-10-CM | POA: Diagnosis not present

## 2021-11-10 DIAGNOSIS — J341 Cyst and mucocele of nose and nasal sinus: Secondary | ICD-10-CM | POA: Diagnosis not present

## 2021-11-11 DIAGNOSIS — M869 Osteomyelitis, unspecified: Secondary | ICD-10-CM | POA: Diagnosis not present

## 2021-11-11 DIAGNOSIS — Z79899 Other long term (current) drug therapy: Secondary | ICD-10-CM | POA: Diagnosis not present

## 2021-11-11 DIAGNOSIS — Z792 Long term (current) use of antibiotics: Secondary | ICD-10-CM | POA: Diagnosis not present

## 2021-11-11 DIAGNOSIS — Z452 Encounter for adjustment and management of vascular access device: Secondary | ICD-10-CM | POA: Diagnosis not present

## 2021-12-02 ENCOUNTER — Encounter: Attending: Physical Medicine & Rehabilitation | Admitting: Physical Medicine & Rehabilitation

## 2021-12-02 ENCOUNTER — Other Ambulatory Visit: Payer: Self-pay

## 2021-12-02 ENCOUNTER — Encounter: Payer: Self-pay | Admitting: Physical Medicine & Rehabilitation

## 2021-12-02 VITALS — BP 118/78 | HR 89 | Temp 98.3°F | Ht 76.0 in | Wt 174.0 lb

## 2021-12-02 DIAGNOSIS — G3189 Other specified degenerative diseases of nervous system: Secondary | ICD-10-CM | POA: Insufficient documentation

## 2021-12-02 DIAGNOSIS — Z87898 Personal history of other specified conditions: Secondary | ICD-10-CM | POA: Insufficient documentation

## 2021-12-02 DIAGNOSIS — S069XAS Unspecified intracranial injury with loss of consciousness status unknown, sequela: Secondary | ICD-10-CM | POA: Insufficient documentation

## 2021-12-02 DIAGNOSIS — F09 Unspecified mental disorder due to known physiological condition: Secondary | ICD-10-CM | POA: Insufficient documentation

## 2021-12-02 NOTE — Patient Instructions (Addendum)
YOU NEED A CONSISTENT BED TIME AT 11PM OR SO!!!  TURN OFF THE LIGHTS, TV AND VIDEO GAMES. PUT ON SOME RELAXING MUSIC IF YOU WOULD LIKE. READ A BOOK OR MAGAZINE IF YOU LIKE.
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
## 2022-01-13 DIAGNOSIS — M869 Osteomyelitis, unspecified: Secondary | ICD-10-CM | POA: Diagnosis not present

## 2022-02-01 DIAGNOSIS — H5211 Myopia, right eye: Secondary | ICD-10-CM | POA: Diagnosis not present

## 2022-02-01 DIAGNOSIS — H52201 Unspecified astigmatism, right eye: Secondary | ICD-10-CM | POA: Diagnosis not present

## 2022-02-01 DIAGNOSIS — H524 Presbyopia: Secondary | ICD-10-CM | POA: Diagnosis not present

## 2022-02-01 DIAGNOSIS — H547 Unspecified visual loss: Secondary | ICD-10-CM | POA: Diagnosis not present

## 2022-02-01 DIAGNOSIS — H31011 Macula scars of posterior pole (postinflammatory) (post-traumatic), right eye: Secondary | ICD-10-CM | POA: Diagnosis not present

## 2022-02-01 DIAGNOSIS — Q111 Other anophthalmos: Secondary | ICD-10-CM | POA: Diagnosis not present

## 2022-02-03 ENCOUNTER — Ambulatory Visit: Payer: Medicare Other | Admitting: Physical Medicine & Rehabilitation

## 2022-02-17 DIAGNOSIS — H5211 Myopia, right eye: Secondary | ICD-10-CM | POA: Diagnosis not present

## 2022-02-17 DIAGNOSIS — H52201 Unspecified astigmatism, right eye: Secondary | ICD-10-CM | POA: Diagnosis not present

## 2022-02-17 DIAGNOSIS — Q111 Other anophthalmos: Secondary | ICD-10-CM | POA: Diagnosis not present

## 2022-02-17 DIAGNOSIS — H31011 Macula scars of posterior pole (postinflammatory) (post-traumatic), right eye: Secondary | ICD-10-CM | POA: Diagnosis not present

## 2022-02-17 DIAGNOSIS — Z87820 Personal history of traumatic brain injury: Secondary | ICD-10-CM | POA: Diagnosis not present

## 2022-03-03 DIAGNOSIS — J341 Cyst and mucocele of nose and nasal sinus: Secondary | ICD-10-CM | POA: Diagnosis not present

## 2022-03-17 ENCOUNTER — Encounter: Payer: Medicare Other | Attending: Physical Medicine & Rehabilitation | Admitting: Physical Medicine & Rehabilitation

## 2022-04-21 DIAGNOSIS — Z79899 Other long term (current) drug therapy: Secondary | ICD-10-CM | POA: Diagnosis not present

## 2022-04-21 DIAGNOSIS — Z792 Long term (current) use of antibiotics: Secondary | ICD-10-CM | POA: Diagnosis not present

## 2022-04-21 DIAGNOSIS — T8579XA Infection and inflammatory reaction due to other internal prosthetic devices, implants and grafts, initial encounter: Secondary | ICD-10-CM | POA: Diagnosis not present

## 2022-04-21 DIAGNOSIS — M869 Osteomyelitis, unspecified: Secondary | ICD-10-CM | POA: Diagnosis not present

## 2022-04-21 DIAGNOSIS — Z87891 Personal history of nicotine dependence: Secondary | ICD-10-CM | POA: Diagnosis not present

## 2022-04-21 DIAGNOSIS — Z803 Family history of malignant neoplasm of breast: Secondary | ICD-10-CM | POA: Diagnosis not present

## 2022-05-19 ENCOUNTER — Encounter: Attending: Physical Medicine & Rehabilitation | Admitting: Physical Medicine & Rehabilitation

## 2022-06-21 DIAGNOSIS — Z1159 Encounter for screening for other viral diseases: Secondary | ICD-10-CM | POA: Diagnosis not present

## 2022-11-16 DIAGNOSIS — Z1159 Encounter for screening for other viral diseases: Secondary | ICD-10-CM | POA: Diagnosis not present

## 2023-01-29 DIAGNOSIS — S80912A Unspecified superficial injury of left knee, initial encounter: Secondary | ICD-10-CM | POA: Diagnosis not present

## 2023-01-29 DIAGNOSIS — M25562 Pain in left knee: Secondary | ICD-10-CM | POA: Diagnosis not present

## 2023-01-29 DIAGNOSIS — S8992XA Unspecified injury of left lower leg, initial encounter: Secondary | ICD-10-CM | POA: Diagnosis not present

## 2023-01-29 DIAGNOSIS — M25062 Hemarthrosis, left knee: Secondary | ICD-10-CM | POA: Diagnosis not present

## 2023-01-31 DIAGNOSIS — S82142A Displaced bicondylar fracture of left tibia, initial encounter for closed fracture: Secondary | ICD-10-CM | POA: Diagnosis not present

## 2023-01-31 DIAGNOSIS — S82122A Displaced fracture of lateral condyle of left tibia, initial encounter for closed fracture: Secondary | ICD-10-CM | POA: Diagnosis not present

## 2023-02-03 DIAGNOSIS — S82142A Displaced bicondylar fracture of left tibia, initial encounter for closed fracture: Secondary | ICD-10-CM | POA: Diagnosis not present

## 2023-02-07 DIAGNOSIS — S82142A Displaced bicondylar fracture of left tibia, initial encounter for closed fracture: Secondary | ICD-10-CM | POA: Diagnosis not present

## 2023-02-07 DIAGNOSIS — S82252A Displaced comminuted fracture of shaft of left tibia, initial encounter for closed fracture: Secondary | ICD-10-CM | POA: Diagnosis not present

## 2023-02-07 DIAGNOSIS — Z87820 Personal history of traumatic brain injury: Secondary | ICD-10-CM | POA: Diagnosis not present

## 2023-02-07 DIAGNOSIS — X509XXA Other and unspecified overexertion or strenuous movements or postures, initial encounter: Secondary | ICD-10-CM | POA: Diagnosis not present

## 2023-02-07 DIAGNOSIS — S82122A Displaced fracture of lateral condyle of left tibia, initial encounter for closed fracture: Secondary | ICD-10-CM | POA: Diagnosis not present

## 2023-02-07 DIAGNOSIS — Z91014 Allergy to mammalian meats: Secondary | ICD-10-CM | POA: Diagnosis not present

## 2023-02-07 NOTE — Discharge Summary (Signed)
 [facility] Discharge Summary  Admit date: 02/07/2023  Discharge date: 02/08/23    Discharge Physician: Arthea JONELLE Norton  Admission Diagnoses: Closed fracture of left tibial plateau, initial encounter [S82.142A] Tibial plateau fracture, left, closed, initial encounter [S82.142A]  Discharge Diagnoses: Same  Principal Problem: Closed fracture of left tibial plateau  Hospital Problems: Principal Problem:   Closed fracture of left tibial plateau Active Problems:   Tibial plateau fracture, left, closed, initial encounter   Hospital Course:  The patient was taken to OR on 02/07/2023 and tolerated procedure well. He was transferred to PACU and to the floor without issue.  On the floor, he tolerated a regular diet and progressed well with PT. He was weaned off IV pain medications and at the time of discharge, pain was controlled with PO pain medications. He was seen day of discharge, Day of Surgery, and determined to be stable for DC by orthopedics. He was discharged home with planned outpatient follow-up.   Please see the discharge instructions for full details regarding postoperative management.  Discharge Medications:    Discharge Medications     .    acetaminophen  500 MG tablet Take 2 tablets (1,000 mg total) by mouth 3 (three) times a day for 30 days. Commonly known as: TYLENOL    aspirin 81 MG chewable tablet Chew 1 tablet (81 mg total) 2 (two) times a day for 30 days.   gabapentin 300 MG capsule Take 1 capsule (300 mg total) by mouth 3 (three) times a day for 30 days. Commonly known as: NEURONTIN   ketorolac  10 mg tablet Take 1 tablet by mouth every 6 (six) hours as needed for pain for up to 5 days. Commonly known as: TORADOL    levETIRAcetam  500 MG tablet Take 1 tablet (500 mg total) by mouth 2 (two) times a day. Commonly known as: KEPPRA    melatonin 10 mg Cap Take 10 mg by mouth nightly.   naproxen 500 MG tablet Take 1 tablet (500 mg total) by mouth 2 (two)  times a day as needed for pain. Commonly known as: NAPROSYN   ondansetron  4 MG disintegrating tablet Dissolve 1 tablet (4 mg total) on the tongue every 8 (eight) hours as needed. Commonly known as: ZOFRAN  ODT   oxyCODONE  5 MG immediate release tablet Take 1 tablet (5 mg total) by mouth every 6 (six) hours as needed for up to 7 days. Commonly known as: ROXICODONE         Consults: None  Significant Diagnostic Studies: N/A  Treatments: ORIF left tibial plateau   Discharged Condition: good  Disposition: Home or Self Care  Follow up: No follow-ups on file.

## 2023-03-03 DIAGNOSIS — S82142A Displaced bicondylar fracture of left tibia, initial encounter for closed fracture: Secondary | ICD-10-CM | POA: Diagnosis not present

## 2023-03-10 DIAGNOSIS — M6281 Muscle weakness (generalized): Secondary | ICD-10-CM | POA: Diagnosis not present

## 2023-03-10 DIAGNOSIS — R262 Difficulty in walking, not elsewhere classified: Secondary | ICD-10-CM | POA: Diagnosis not present

## 2023-03-10 DIAGNOSIS — M25562 Pain in left knee: Secondary | ICD-10-CM | POA: Diagnosis not present

## 2023-03-10 DIAGNOSIS — S82142A Displaced bicondylar fracture of left tibia, initial encounter for closed fracture: Secondary | ICD-10-CM | POA: Diagnosis not present

## 2023-03-10 DIAGNOSIS — M25662 Stiffness of left knee, not elsewhere classified: Secondary | ICD-10-CM | POA: Diagnosis not present

## 2023-03-14 DIAGNOSIS — M549 Dorsalgia, unspecified: Secondary | ICD-10-CM | POA: Diagnosis not present

## 2023-04-07 DIAGNOSIS — R262 Difficulty in walking, not elsewhere classified: Secondary | ICD-10-CM | POA: Diagnosis not present

## 2023-04-07 DIAGNOSIS — M25662 Stiffness of left knee, not elsewhere classified: Secondary | ICD-10-CM | POA: Diagnosis not present

## 2023-04-07 DIAGNOSIS — S82142A Displaced bicondylar fracture of left tibia, initial encounter for closed fracture: Secondary | ICD-10-CM | POA: Diagnosis not present

## 2023-04-07 DIAGNOSIS — M6281 Muscle weakness (generalized): Secondary | ICD-10-CM | POA: Diagnosis not present

## 2023-04-07 DIAGNOSIS — M25562 Pain in left knee: Secondary | ICD-10-CM | POA: Diagnosis not present

## 2023-04-12 DIAGNOSIS — R262 Difficulty in walking, not elsewhere classified: Secondary | ICD-10-CM | POA: Diagnosis not present

## 2023-04-12 DIAGNOSIS — M25662 Stiffness of left knee, not elsewhere classified: Secondary | ICD-10-CM | POA: Diagnosis not present

## 2023-04-12 DIAGNOSIS — M25562 Pain in left knee: Secondary | ICD-10-CM | POA: Diagnosis not present

## 2023-04-12 DIAGNOSIS — M6281 Muscle weakness (generalized): Secondary | ICD-10-CM | POA: Diagnosis not present

## 2023-04-12 DIAGNOSIS — S82142A Displaced bicondylar fracture of left tibia, initial encounter for closed fracture: Secondary | ICD-10-CM | POA: Diagnosis not present

## 2023-04-14 DIAGNOSIS — S82142A Displaced bicondylar fracture of left tibia, initial encounter for closed fracture: Secondary | ICD-10-CM | POA: Diagnosis not present

## 2023-04-14 DIAGNOSIS — M25562 Pain in left knee: Secondary | ICD-10-CM | POA: Diagnosis not present

## 2023-04-14 DIAGNOSIS — M25662 Stiffness of left knee, not elsewhere classified: Secondary | ICD-10-CM | POA: Diagnosis not present

## 2023-04-14 DIAGNOSIS — M6281 Muscle weakness (generalized): Secondary | ICD-10-CM | POA: Diagnosis not present

## 2023-04-14 DIAGNOSIS — R262 Difficulty in walking, not elsewhere classified: Secondary | ICD-10-CM | POA: Diagnosis not present

## 2023-04-19 DIAGNOSIS — M6281 Muscle weakness (generalized): Secondary | ICD-10-CM | POA: Diagnosis not present

## 2023-04-19 DIAGNOSIS — M25562 Pain in left knee: Secondary | ICD-10-CM | POA: Diagnosis not present

## 2023-04-19 DIAGNOSIS — R262 Difficulty in walking, not elsewhere classified: Secondary | ICD-10-CM | POA: Diagnosis not present

## 2023-04-19 DIAGNOSIS — M25662 Stiffness of left knee, not elsewhere classified: Secondary | ICD-10-CM | POA: Diagnosis not present

## 2023-04-19 DIAGNOSIS — S82142A Displaced bicondylar fracture of left tibia, initial encounter for closed fracture: Secondary | ICD-10-CM | POA: Diagnosis not present

## 2023-04-28 DIAGNOSIS — S82142A Displaced bicondylar fracture of left tibia, initial encounter for closed fracture: Secondary | ICD-10-CM | POA: Diagnosis not present

## 2023-05-10 DIAGNOSIS — M25662 Stiffness of left knee, not elsewhere classified: Secondary | ICD-10-CM | POA: Diagnosis not present

## 2023-05-10 DIAGNOSIS — M25562 Pain in left knee: Secondary | ICD-10-CM | POA: Diagnosis not present

## 2023-05-10 DIAGNOSIS — M6281 Muscle weakness (generalized): Secondary | ICD-10-CM | POA: Diagnosis not present

## 2023-05-10 DIAGNOSIS — S82142A Displaced bicondylar fracture of left tibia, initial encounter for closed fracture: Secondary | ICD-10-CM | POA: Diagnosis not present

## 2023-05-10 DIAGNOSIS — R262 Difficulty in walking, not elsewhere classified: Secondary | ICD-10-CM | POA: Diagnosis not present

## 2023-05-12 DIAGNOSIS — S82142A Displaced bicondylar fracture of left tibia, initial encounter for closed fracture: Secondary | ICD-10-CM | POA: Diagnosis not present

## 2023-05-12 DIAGNOSIS — M25662 Stiffness of left knee, not elsewhere classified: Secondary | ICD-10-CM | POA: Diagnosis not present

## 2023-05-12 DIAGNOSIS — R262 Difficulty in walking, not elsewhere classified: Secondary | ICD-10-CM | POA: Diagnosis not present

## 2023-05-12 DIAGNOSIS — M25562 Pain in left knee: Secondary | ICD-10-CM | POA: Diagnosis not present

## 2023-05-12 DIAGNOSIS — M6281 Muscle weakness (generalized): Secondary | ICD-10-CM | POA: Diagnosis not present

## 2023-05-17 DIAGNOSIS — R262 Difficulty in walking, not elsewhere classified: Secondary | ICD-10-CM | POA: Diagnosis not present

## 2023-05-17 DIAGNOSIS — S82142A Displaced bicondylar fracture of left tibia, initial encounter for closed fracture: Secondary | ICD-10-CM | POA: Diagnosis not present

## 2023-05-17 DIAGNOSIS — M25562 Pain in left knee: Secondary | ICD-10-CM | POA: Diagnosis not present

## 2023-05-17 DIAGNOSIS — M6281 Muscle weakness (generalized): Secondary | ICD-10-CM | POA: Diagnosis not present

## 2023-05-17 DIAGNOSIS — M25662 Stiffness of left knee, not elsewhere classified: Secondary | ICD-10-CM | POA: Diagnosis not present

## 2023-05-19 DIAGNOSIS — M25662 Stiffness of left knee, not elsewhere classified: Secondary | ICD-10-CM | POA: Diagnosis not present

## 2023-05-19 DIAGNOSIS — R262 Difficulty in walking, not elsewhere classified: Secondary | ICD-10-CM | POA: Diagnosis not present

## 2023-05-19 DIAGNOSIS — M6281 Muscle weakness (generalized): Secondary | ICD-10-CM | POA: Diagnosis not present

## 2023-05-19 DIAGNOSIS — S82142A Displaced bicondylar fracture of left tibia, initial encounter for closed fracture: Secondary | ICD-10-CM | POA: Diagnosis not present

## 2023-05-19 DIAGNOSIS — M25562 Pain in left knee: Secondary | ICD-10-CM | POA: Diagnosis not present

## 2023-05-24 DIAGNOSIS — R262 Difficulty in walking, not elsewhere classified: Secondary | ICD-10-CM | POA: Diagnosis not present

## 2023-05-24 DIAGNOSIS — S82142A Displaced bicondylar fracture of left tibia, initial encounter for closed fracture: Secondary | ICD-10-CM | POA: Diagnosis not present

## 2023-05-24 DIAGNOSIS — M25662 Stiffness of left knee, not elsewhere classified: Secondary | ICD-10-CM | POA: Diagnosis not present

## 2023-05-24 DIAGNOSIS — M6281 Muscle weakness (generalized): Secondary | ICD-10-CM | POA: Diagnosis not present

## 2023-05-24 DIAGNOSIS — M25562 Pain in left knee: Secondary | ICD-10-CM | POA: Diagnosis not present

## 2023-05-26 DIAGNOSIS — S82142A Displaced bicondylar fracture of left tibia, initial encounter for closed fracture: Secondary | ICD-10-CM | POA: Diagnosis not present

## 2023-05-26 DIAGNOSIS — M25662 Stiffness of left knee, not elsewhere classified: Secondary | ICD-10-CM | POA: Diagnosis not present

## 2023-05-26 DIAGNOSIS — R262 Difficulty in walking, not elsewhere classified: Secondary | ICD-10-CM | POA: Diagnosis not present

## 2023-05-26 DIAGNOSIS — M6281 Muscle weakness (generalized): Secondary | ICD-10-CM | POA: Diagnosis not present

## 2023-05-26 DIAGNOSIS — M25562 Pain in left knee: Secondary | ICD-10-CM | POA: Diagnosis not present

## 2023-05-31 DIAGNOSIS — M25562 Pain in left knee: Secondary | ICD-10-CM | POA: Diagnosis not present

## 2023-05-31 DIAGNOSIS — S82142A Displaced bicondylar fracture of left tibia, initial encounter for closed fracture: Secondary | ICD-10-CM | POA: Diagnosis not present

## 2023-05-31 DIAGNOSIS — M6281 Muscle weakness (generalized): Secondary | ICD-10-CM | POA: Diagnosis not present

## 2023-05-31 DIAGNOSIS — M25662 Stiffness of left knee, not elsewhere classified: Secondary | ICD-10-CM | POA: Diagnosis not present

## 2023-05-31 DIAGNOSIS — R262 Difficulty in walking, not elsewhere classified: Secondary | ICD-10-CM | POA: Diagnosis not present

## 2023-11-10 DIAGNOSIS — S062X9S Diffuse traumatic brain injury with loss of consciousness of unspecified duration, sequela: Secondary | ICD-10-CM | POA: Diagnosis not present

## 2024-02-01 DIAGNOSIS — R079 Chest pain, unspecified: Secondary | ICD-10-CM | POA: Diagnosis not present

## 2024-02-01 DIAGNOSIS — Z9889 Other specified postprocedural states: Secondary | ICD-10-CM | POA: Diagnosis not present

## 2024-02-01 DIAGNOSIS — Z87820 Personal history of traumatic brain injury: Secondary | ICD-10-CM | POA: Diagnosis not present

## 2024-02-01 DIAGNOSIS — Z885 Allergy status to narcotic agent status: Secondary | ICD-10-CM | POA: Diagnosis not present

## 2024-02-01 DIAGNOSIS — Z79899 Other long term (current) drug therapy: Secondary | ICD-10-CM | POA: Diagnosis not present

## 2024-02-01 DIAGNOSIS — S39012A Strain of muscle, fascia and tendon of lower back, initial encounter: Secondary | ICD-10-CM | POA: Diagnosis not present

## 2024-04-07 DIAGNOSIS — Z79899 Other long term (current) drug therapy: Secondary | ICD-10-CM | POA: Diagnosis not present

## 2024-04-07 DIAGNOSIS — T450X1A Poisoning by antiallergic and antiemetic drugs, accidental (unintentional), initial encounter: Secondary | ICD-10-CM | POA: Diagnosis not present

## 2024-04-07 DIAGNOSIS — X58XXXA Exposure to other specified factors, initial encounter: Secondary | ICD-10-CM | POA: Diagnosis not present

## 2024-04-07 DIAGNOSIS — D72829 Elevated white blood cell count, unspecified: Secondary | ICD-10-CM | POA: Diagnosis not present

## 2024-04-07 DIAGNOSIS — Z87891 Personal history of nicotine dependence: Secondary | ICD-10-CM | POA: Diagnosis not present

## 2024-04-07 DIAGNOSIS — I4510 Unspecified right bundle-branch block: Secondary | ICD-10-CM | POA: Diagnosis not present

## 2024-04-07 DIAGNOSIS — T50901A Poisoning by unspecified drugs, medicaments and biological substances, accidental (unintentional), initial encounter: Secondary | ICD-10-CM | POA: Diagnosis not present

## 2024-04-07 DIAGNOSIS — R000 Tachycardia, unspecified: Secondary | ICD-10-CM | POA: Diagnosis not present

## 2024-04-07 NOTE — ED Provider Notes (Signed)
 [facility] EMERGENCY DEPARTMENT  ED Provider Note History   Chief Complaint  Patient presents with  . Drug Overdose   History of Present Illness   Mr. Plascencia is a 48 year old male who presents to the emergency department with concerns for accidental overdose.  He is allergic to his brother's cat and he stayed with his brother.  He was feeling allergic symptoms and so he began taking his allergy medicine.  He took 25 tablets of 5 mg each Xyzal.  This morning he also took 4 Claritin tablets.  He feels his allergy symptoms are improved.  He comes to the ED based on his brother's recommendation for possible overdose.  Triage nurse called poison control who recommended salicylate level and BMP. Patient Info:    History provided by:  Patient   Past Medical History:  Diagnosis Date  . Asthma without status asthmaticus (HHS-HCC)   . Head trauma   . Seizures (CMS/HHS-HCC)    Past Surgical History:  Procedure Laterality Date  . FRONTAL SINUSOTOMY Right 10/01/2021   Procedure: SINUSOTOMY FRONTAL; OBLITERATIVE WITHOUT OSTEOPLASTIC FLAP, BROW INCISION (INCLUDES ABLATION);  Surgeon: Baena Alm Pride, MD;  Location: [facility] OPERATING ROOMS;  Service: Plastic Surgery;  Laterality: Right;  . CRANIOPLASTY FOR REPAIR SKULL DEFECT Bilateral 10/01/2021   Procedure: CRANIOPLASTY FOR SKULL DEFECT, >5 CM;  Surgeon: Monday Alm Pride, MD;  Location: [facility];  Service: Plastic Surgery;  Laterality: Bilateral;  . REMOVAL HARDWARE FACE Bilateral 10/01/2021   Procedure: REMOVAL OF IMPLANT; FACE, DEEP (INFECTED POROUS POLYETHYLENE PLATE AND 8 SCREWS);  Surgeon: Stormont Alm Pride, MD;  Location: [facility];  Service: Plastic Surgery;  Laterality: Bilateral;  . TRANSFER ADJACENT TISSUE SCALP Bilateral 10/01/2021   Procedure: ADJACENT TISSUE TRANSFER OR REARRANGEMENT, SCALP; DEFECT 10.1 SQ CM TO 30.0 SQ CM;  Surgeon: Ahlberg Alm Pride, MD;  Location: [facility];  Service: Plastic Surgery;   Laterality: Bilateral;  . WOUND PREP / INCISIONAL RELEASE SCAR ANKLE/FOOR/TOE Bilateral 10/01/2021   Procedure: SURGICAL WOUND PREP (INCL SUBQ TISSUE), SCALP/HEAD/NECK; FIRST 100 SQ CM;  Surgeon: Pyon Alm Pride, MD;  Location: [facility];  Service: Plastic Surgery;  Laterality: Bilateral;  . CRANIECTOMY FOR OSTEOMYELITIS Bilateral 10/01/2021   Procedure: CRANIECTOMY; FOR OSTEOMYELITIS;  Surgeon: Saturnino Peggye Barrio, MD;  Location: [facility];  Service: Neurosurgery;  Laterality: Bilateral;  . ORBITAL EYE SURGERY Left    enucleated  . right frontal brain lobectomy    . Right leg fracture repair    . VITREOUS RETINAL SURGERY Left    nasal RD&comotio    Family History  Problem Relation Age of Onset  . Breast cancer Mother   . High blood pressure (Hypertension) Mother   . Cancer Father        cholangiocarcinoma  . Blindness Neg Hx   . Glaucoma Neg Hx   . Macular degeneration Neg Hx   . Retinal degeneration Neg Hx    Social History   Socioeconomic History  . Marital status: Single  Occupational History  . Occupation: disabled  Tobacco Use  . Smoking status: Former    Types: Cigars    Quit date: 2022    Years since quitting: 3.4  . Smokeless tobacco: Never  . Tobacco comments:    Smokes occasional cigarello  Vaping Use  . Vaping status: Never Used  Substance and Sexual Activity  . Alcohol use: Not Currently  . Drug use: Not Currently    Types: Marijuana    Comment: occasional marijuana  .  Sexual activity: Not Currently   Social Drivers of Health   Financial Resource Strain: Low Risk  (10/15/2021)   Overall Financial Resource Strain (CARDIA)   . Difficulty of Paying Living Expenses: Not very hard  Food Insecurity: No Food Insecurity (01/13/2022)   Hunger Vital Sign   . Worried About Programme researcher, broadcasting/film/video in the Last Year: Never true   . Ran Out of Food in the Last Year: Never true  Transportation Needs: No Transportation Needs (10/15/2021)    PRAPARE - Transportation   . Lack of Transportation (Medical): No   . Lack of Transportation (Non-Medical): No   Review of Systems  Constitutional:  Negative for chills and fever.  HENT:  Negative for rhinorrhea and sore throat.   Eyes:  Negative for visual disturbance.  Respiratory:  Positive for wheezing. Negative for shortness of breath.   Cardiovascular:  Negative for chest pain.  Gastrointestinal:  Negative for abdominal pain, blood in stool, diarrhea, nausea and vomiting.  Genitourinary:  Negative for dysuria.  Musculoskeletal:  Negative for neck pain and neck stiffness.  Skin:  Negative for rash.  Neurological:  Negative for syncope.    Physical Exam  BP (!) 134/106 (BP Location: Right upper arm, Patient Position: Sitting)   Pulse (!) 125   Temp 36.8 C (98.2 F) (Oral)   Resp 25   Ht 193 cm (6' 4)   Wt 83.9 kg (185 lb)   SpO2 96%   BMI 22.52 kg/m  Physical Exam Vitals and nursing note reviewed.  Constitutional:      Appearance: He is well-developed.  HENT:     Head: Normocephalic and atraumatic.  Eyes:     Pupils: Pupils are equal, round, and reactive to light.  Cardiovascular:     Rate and Rhythm: Regular rhythm. Tachycardia present.     Heart sounds: Normal heart sounds. No murmur heard.    No friction rub. No gallop.  Pulmonary:     Effort: Pulmonary effort is normal. No respiratory distress.     Breath sounds: Wheezing present. No rales.  Abdominal:     General: Bowel sounds are normal. There is no distension.     Palpations: Abdomen is soft. There is no mass.     Tenderness: There is no abdominal tenderness. There is no guarding or rebound.  Musculoskeletal:        General: Normal range of motion.     Cervical back: Normal range of motion and neck supple.  Skin:    General: Skin is warm and dry.  Neurological:     Mental Status: He is alert and oriented to person, place, and time.  Psychiatric:        Behavior: Behavior normal.     Procedures   Critical Care  Performed by: Kennieth Rosaline CROME, MD Authorized by: Kennieth Rosaline CROME, MD   Critical care provider statement:    Critical care time (minutes):  35   Critical care was necessary to treat or prevent imminent or life-threatening deterioration of the following conditions:  Respiratory failure (Overdose)   Critical care was time spent personally by me on the following activities:  Evaluation of patient's response to treatment, examination of patient, obtaining history from patient or surrogate, ordering and review of radiographic studies, ordering and review of laboratory studies, pulse oximetry, re-evaluation of patient's condition and review of old charts    Medical Decision Making   Medical Complexity:    New and requires workup.   Summary Statement:  Mr. Trinka is a 48 year old male who presents to the emergency department after accidental overdose on Xyzal.  He was trying to treat his allergy symptoms.  Continues to have some wheezing in the ED.  Wheezing is cleared with steroids and albuterol .  Electrolytes without large derangement, no significant anemia.  Mildly elevated white blood cell count but no obvious infection.  Poison control contacted and does not feel that patient requires admission.  Patient's brother at bedside states he will watch him closely today.  Patient at his mental status baseline.  Patient desires DC home.  Wheezing resolved after treatment.          ED Clinical Impression  1. Accidental overdose, initial encounter                Pleasant, Rosaline CROME, MD 04/07/24 1424

## 2024-04-07 NOTE — ED Notes (Addendum)
 Poison control contacted. Recommended:  EKG Tylenol  level BMP Supportive care Watch back to BL

## 2024-06-04 DIAGNOSIS — H5211 Myopia, right eye: Secondary | ICD-10-CM | POA: Diagnosis not present

## 2024-06-04 DIAGNOSIS — H524 Presbyopia: Secondary | ICD-10-CM | POA: Diagnosis not present

## 2024-06-04 DIAGNOSIS — Z87820 Personal history of traumatic brain injury: Secondary | ICD-10-CM | POA: Diagnosis not present

## 2024-06-04 DIAGNOSIS — Q111 Other anophthalmos: Secondary | ICD-10-CM | POA: Diagnosis not present

## 2024-06-04 DIAGNOSIS — H53411 Scotoma involving central area, right eye: Secondary | ICD-10-CM | POA: Diagnosis not present

## 2024-06-04 DIAGNOSIS — H52201 Unspecified astigmatism, right eye: Secondary | ICD-10-CM | POA: Diagnosis not present

## 2024-06-04 DIAGNOSIS — H31011 Macula scars of posterior pole (postinflammatory) (post-traumatic), right eye: Secondary | ICD-10-CM | POA: Diagnosis not present
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
## 2024-06-10 ENCOUNTER — Emergency Department (HOSPITAL_COMMUNITY)

## 2024-06-10 ENCOUNTER — Observation Stay (HOSPITAL_COMMUNITY)

## 2024-06-10 ENCOUNTER — Observation Stay (HOSPITAL_COMMUNITY)
Admission: EM | Admit: 2024-06-10 | Discharge: 2024-06-11 | Disposition: A | Discharge status: Home / Self Care | Attending: Student | Admitting: Student

## 2024-06-10 ENCOUNTER — Encounter (HOSPITAL_COMMUNITY): Payer: Self-pay | Admitting: Emergency Medicine

## 2024-06-10 ENCOUNTER — Other Ambulatory Visit: Payer: Self-pay

## 2024-06-10 DIAGNOSIS — D72819 Decreased white blood cell count, unspecified: Secondary | ICD-10-CM | POA: Diagnosis not present

## 2024-06-10 DIAGNOSIS — R17 Unspecified jaundice: Secondary | ICD-10-CM | POA: Insufficient documentation

## 2024-06-10 DIAGNOSIS — J45909 Unspecified asthma, uncomplicated: Secondary | ICD-10-CM | POA: Diagnosis not present

## 2024-06-10 DIAGNOSIS — E876 Hypokalemia: Secondary | ICD-10-CM | POA: Diagnosis not present

## 2024-06-10 DIAGNOSIS — F12920 Cannabis use, unspecified with intoxication, uncomplicated: Secondary | ICD-10-CM | POA: Diagnosis not present

## 2024-06-10 DIAGNOSIS — R569 Unspecified convulsions: Secondary | ICD-10-CM | POA: Diagnosis not present

## 2024-06-10 DIAGNOSIS — G9389 Other specified disorders of brain: Secondary | ICD-10-CM | POA: Diagnosis not present

## 2024-06-10 DIAGNOSIS — D72825 Bandemia: Secondary | ICD-10-CM | POA: Insufficient documentation

## 2024-06-10 DIAGNOSIS — R404 Transient alteration of awareness: Secondary | ICD-10-CM | POA: Diagnosis not present

## 2024-06-10 DIAGNOSIS — E872 Acidosis, unspecified: Secondary | ICD-10-CM | POA: Diagnosis not present

## 2024-06-10 DIAGNOSIS — G40909 Epilepsy, unspecified, not intractable, without status epilepticus: Secondary | ICD-10-CM

## 2024-06-10 DIAGNOSIS — G4089 Other seizures: Secondary | ICD-10-CM | POA: Diagnosis not present

## 2024-06-10 DIAGNOSIS — R000 Tachycardia, unspecified: Secondary | ICD-10-CM | POA: Diagnosis not present

## 2024-06-10 DIAGNOSIS — R9431 Abnormal electrocardiogram [ECG] [EKG]: Secondary | ICD-10-CM | POA: Diagnosis not present

## 2024-06-10 DIAGNOSIS — Z03822 Encounter for observation for suspected aspirated (inhaled) foreign body ruled out: Secondary | ICD-10-CM | POA: Diagnosis not present

## 2024-06-10 DIAGNOSIS — I959 Hypotension, unspecified: Secondary | ICD-10-CM | POA: Diagnosis not present

## 2024-06-10 DIAGNOSIS — Z982 Presence of cerebrospinal fluid drainage device: Secondary | ICD-10-CM | POA: Diagnosis not present

## 2024-06-10 LAB — RAPID URINE DRUG SCREEN, HOSP PERFORMED
Amphetamines: NOT DETECTED
Barbiturates: NOT DETECTED
Benzodiazepines: NOT DETECTED
Cocaine: NOT DETECTED
Opiates: NOT DETECTED
Tetrahydrocannabinol: POSITIVE — AB

## 2024-06-10 LAB — CBC WITH DIFFERENTIAL/PLATELET
Abs Immature Granulocytes: 0.15 K/uL — ABNORMAL HIGH (ref 0.00–0.07)
Basophils Absolute: 0 K/uL (ref 0.0–0.1)
Basophils Relative: 0 %
Eosinophils Absolute: 0 K/uL (ref 0.0–0.5)
Eosinophils Relative: 0 %
HCT: 41.1 % (ref 39.0–52.0)
Hemoglobin: 13.4 g/dL (ref 13.0–17.0)
Immature Granulocytes: 1 %
Lymphocytes Relative: 2 %
Lymphs Abs: 0.4 K/uL — ABNORMAL LOW (ref 0.7–4.0)
MCH: 29.3 pg (ref 26.0–34.0)
MCHC: 32.6 g/dL (ref 30.0–36.0)
MCV: 89.9 fL (ref 80.0–100.0)
Monocytes Absolute: 1 K/uL (ref 0.1–1.0)
Monocytes Relative: 5 %
Neutro Abs: 17.7 K/uL — ABNORMAL HIGH (ref 1.7–7.7)
Neutrophils Relative %: 92 %
Platelets: 175 K/uL (ref 150–400)
RBC: 4.57 MIL/uL (ref 4.22–5.81)
RDW: 12.9 % (ref 11.5–15.5)
WBC: 19.3 K/uL — ABNORMAL HIGH (ref 4.0–10.5)
nRBC: 0 % (ref 0.0–0.2)

## 2024-06-10 LAB — URINALYSIS, ROUTINE W REFLEX MICROSCOPIC
Bacteria, UA: NONE SEEN
Bilirubin Urine: NEGATIVE
Glucose, UA: NEGATIVE mg/dL
Ketones, ur: NEGATIVE mg/dL
Leukocytes,Ua: NEGATIVE
Nitrite: NEGATIVE
Protein, ur: NEGATIVE mg/dL
Specific Gravity, Urine: 1.005 (ref 1.005–1.030)
pH: 8 (ref 5.0–8.0)

## 2024-06-10 LAB — COMPREHENSIVE METABOLIC PANEL WITH GFR
ALT: 21 U/L (ref 0–44)
AST: 36 U/L (ref 15–41)
Albumin: 4 g/dL (ref 3.5–5.0)
Alkaline Phosphatase: 58 U/L (ref 38–126)
Anion gap: 16 — ABNORMAL HIGH (ref 5–15)
BUN: 8 mg/dL (ref 6–20)
CO2: 18 mmol/L — ABNORMAL LOW (ref 22–32)
Calcium: 9 mg/dL (ref 8.9–10.3)
Chloride: 103 mmol/L (ref 98–111)
Creatinine, Ser: 1.21 mg/dL (ref 0.61–1.24)
GFR, Estimated: >60 mL/min (ref >60)
Glucose, Bld: 164 mg/dL — ABNORMAL HIGH (ref 70–99)
Potassium: 3.3 mmol/L — ABNORMAL LOW (ref 3.5–5.1)
Sodium: 137 mmol/L (ref 135–145)
Total Bilirubin: 0.9 mg/dL (ref 0.0–1.2)
Total Protein: 6.6 g/dL (ref 6.5–8.1)

## 2024-06-10 LAB — MAGNESIUM: Magnesium: 2.5 mg/dL — ABNORMAL HIGH (ref 1.7–2.4)

## 2024-06-10 LAB — ETHANOL: Alcohol, Ethyl (B): <15 mg/dL (ref <15)

## 2024-06-10 MED ORDER — SODIUM CHLORIDE 0.9 % IV BOLUS (SEPSIS)
1000.0000 mL | Freq: Once | INTRAVENOUS | Status: AC
  Administered 2024-06-10: 1000 mL via INTRAVENOUS

## 2024-06-10 MED ORDER — ONDANSETRON HCL 4 MG/2ML IJ SOLN: 4.0000 mg | Freq: Four times a day (QID) | INTRAMUSCULAR | Status: DC | PRN

## 2024-06-10 MED ORDER — SENNOSIDES-DOCUSATE SODIUM 8.6-50 MG PO TABS
2.0000 | ORAL_TABLET | Freq: Two times a day (BID) | ORAL | Status: DC
  Administered 2024-06-10: 2 via ORAL
  Filled 2024-06-10 (×2): qty 2

## 2024-06-10 MED ORDER — ORAL CARE MOUTH RINSE: 15.0000 mL | OROMUCOSAL | Status: DC | PRN

## 2024-06-10 MED ORDER — LEVETIRACETAM 750 MG PO TABS
750.0000 mg | ORAL_TABLET | Freq: Two times a day (BID) | ORAL | Status: DC
  Administered 2024-06-10 – 2024-06-11 (×3): 750 mg via ORAL
  Filled 2024-06-10 (×2): qty 1

## 2024-06-10 MED ORDER — ACETAMINOPHEN 650 MG RE SUPP
650.0000 mg | Freq: Four times a day (QID) | RECTAL | Status: DC | PRN
Start: 2024-06-10 — End: 2024-06-11

## 2024-06-10 MED ORDER — LACTATED RINGERS IV SOLN: INTRAVENOUS | Status: AC

## 2024-06-10 MED ORDER — ACETAMINOPHEN 325 MG PO TABS
650.0000 mg | ORAL_TABLET | Freq: Four times a day (QID) | ORAL | Status: DC | PRN
  Administered 2024-06-10 (×2): 650 mg via ORAL
  Filled 2024-06-10 (×2): qty 2

## 2024-06-10 MED ORDER — LEVETIRACETAM (KEPPRA) 500 MG/5 ML ADULT IV PUSH
4500.0000 mg | Freq: Once | INTRAVENOUS | Status: AC
  Administered 2024-06-10: 4500 mg via INTRAVENOUS
  Filled 2024-06-10: qty 45

## 2024-06-10 NOTE — Progress Notes (Signed)
 EEG complete - results pending

## 2024-06-10 NOTE — Progress Notes (Signed)
  Carryover admission to the Day Admitter.  I discussed this case with the EDP, Dr. Midge.  Per these discussions:   This is a 48 year old male with history of seizures, traumatic brain injury, VP shunt, who presents with a single breakthrough tonic-clonic seizure earlier today which terminated spontaneously, followed by postictal state, with patient now noted to be gradually waking up.  No additional overt episodes of seizure since arrival in the ED.  This is reported to be the patient's first breakthrough seizure in about 2 years, and he is reported to be compliant with his outpatient Keppra .  He lives in a group home in [place], but was visiting his mother in the [place] area this evening when the above tonic-clonic episode occurred.  The patient is not currently established with neurology as an outpatient.  He has previously followed with neurosurgery in the setting of his history of VP shunt, but does not currently follow with neurology as an outpatient.   He was reported to be hypotensive for EMS.  Here, systolic blood pressures been in the high 90s to low 100s mmHg. Afebrile.   CT head was reported showed no interval change relative to most recent prior imaging of the head.  EDP has discussed with on-call neurology, Dr. Michaela, who ordered IV Keppra  load.   The patient is also received a 1 L NS bolus in the ED.   I have placed an order for observation to PCU for further evaluation and management of the above.  I have placed some additional preliminary admit orders via the adult multi-morbid admission order set. I have also ordered n.p.o. for now, magnesium  level, as well as continuous LR at 125 cc/h in the setting of borderline soft blood pressures, with systolic values remaining in the high 90s to low 100s mmHg. there is an existing order for seizure precautions.    Eva Pore, DO Hospitalist

## 2024-06-10 NOTE — Progress Notes (Addendum)
 Same-day progress note  Patient seen and examined No headaches or new complaints.  No seizures noted in the ED. Spoke with the mother who reported that over the past few weeks to months he has been having trouble with short-term memory.  She then arrived at bedside and says that he still appears a little bit more confused than his baseline.  Speech appears normal  Neurological exam: Awake alert oriented to self, unable to tell me where he is-thinks he is at his group home.  Unaware of the month or time.  No dysarthria, no aphasia, diminished attention concentration, left prosthetic eye, right pupil round reactive with good visual acuity, no gross visual field deficits, mild left-sided facial weakness, antigravity in all fours with intact sensation in all fours.  Imaging personally reviewed-CT of the head shows stable right frontal craniotomy, frontal encephalomalacia and right frontal ventriculostomy catheter with decompressed ventricles.  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  No acute findings.  Impression: Breakthrough seizure in the setting of TBI and history of seizures   Recommendations Continue the Keppra  at increased dose of 750 mg twice daily Watch overnight to ensure that his mental status is at or near baseline. Check UA and chest x-ray to make sure there is no infection that is causing lowering of seizure threshold Maintain seizure precautions Routine EEG Should be okay to discharge and follow-up with outpatient neurology as long as he is near or at baseline tomorrow. Plan discussed with Dr. Georgina, admitting hospitalist  Eligio Lav, MD Neurology
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
## 2024-06-10 NOTE — Plan of Care (Signed)
  Problem: Clinical Measurements: Goal: Respiratory complications will improve Outcome: Progressing Goal: Cardiovascular complication will be avoided Outcome: Progressing   Problem: Activity: Goal: Risk for activity intolerance will decrease Outcome: Progressing   Problem: Nutrition: Goal: Adequate nutrition will be maintained Outcome: Progressing   Problem: Elimination: Goal: Will not experience complications related to urinary retention Outcome: Progressing   Problem: Skin Integrity: Goal: Risk for impaired skin integrity will decrease Outcome: Progressing   Problem: Coping: Goal: Ability to adjust to condition or change in health will improve Outcome: Progressing Goal: Ability to identify appropriate support needs will improve Outcome: Progressing   Problem: Self-Concept: Goal: Ability to verbalize feelings about condition will improve Outcome: Progressing

## 2024-06-10 NOTE — ED Notes (Signed)
PT assisted to bedside commode

## 2024-06-10 NOTE — Procedures (Signed)
 Patient Name: Mark Chambers  MRN: [id number]  Epilepsy Attending: Arlin MALVA Krebs  Referring Physician/Provider: Voncile Isles, MD  Date: 06/10/2024 Duration: 23.03 mins  Patient history: 48yo M with Known TBI with a large frontal mucocele after skull/sinus reconstruction, known history of seizures with no seizure in many years but now with breakthrough seizure.  Still remains confused after many hours of having had a seizure and no clinical seizure activity currently apparent.  Nonfocal exam other than some confusion as to place and time. EEG to evaluate for seizure  Level of alertness: Awake, drowsy  AEDs during EEG study: LEV  Technical aspects: This EEG study was done with scalp electrodes positioned according to the 10-20 International system of electrode placement. Electrical activity was reviewed with band pass filter of 1-70Hz , sensitivity of 7 uV/mm, display speed of 55mm/sec with a 60Hz  notched filter applied as appropriate. EEG data were recorded continuously and digitally stored.  Video monitoring was available and reviewed as appropriate.  Description: The posterior dominant rhythm consists of 8 Hz activity of moderate voltage (25-35 uV) seen predominantly in posterior head regions, symmetric and reactive to eye opening and eye closing. Drowsiness was characterized by attenuation of the posterior background rhythm. EEG showed intermittent generalized polymorphic 3 to 6 Hz theta-delta slowing. Physiologic photic driving was not seen during photic stimulation.  Hyperventilation was not performed.     ABNORMALITY - Intermittent slow, generalized  IMPRESSION: This study is suggestive of mild diffuse encephalopathy. No seizures or epileptiform discharges were seen throughout the recording.  Thos Matsumoto O Kanaan Kagawa
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
## 2024-06-10 NOTE — ED Triage Notes (Addendum)
 BIBA Per EMS: Pt coming from home w/ c/o witnessed seizure. Unknown how long seizure lasted. Tonic clonic. Hx TBI 20 yrs ago. Last sz 2 yrs ago. No change in meds. Takes keppra  normally.  Pt did bite his tongue and urinate on himself. Upon EMS arrival pt was unresponsive w/ no radial pulses. 50/40 BP. P 108/64 upon arrival to ED. 500cc fluid given en route. Postictal currently & has been x . 18G L wrist  20G L bicep.  VSS currently.

## 2024-06-11 DIAGNOSIS — H5442A5 Blindness left eye category 5, normal vision right eye: Secondary | ICD-10-CM | POA: Insufficient documentation

## 2024-06-11 DIAGNOSIS — G40909 Epilepsy, unspecified, not intractable, without status epilepticus: Secondary | ICD-10-CM | POA: Diagnosis not present

## 2024-06-11 DIAGNOSIS — R569 Unspecified convulsions: Secondary | ICD-10-CM | POA: Diagnosis not present

## 2024-06-11 LAB — COMPREHENSIVE METABOLIC PANEL WITH GFR
ALT: 31 U/L (ref 0–44)
AST: 101 U/L — ABNORMAL HIGH (ref 15–41)
Albumin: 3.8 g/dL (ref 3.5–5.0)
Alkaline Phosphatase: 55 U/L (ref 38–126)
Anion gap: 11 (ref 5–15)
BUN: 6 mg/dL (ref 6–20)
CO2: 23 mmol/L (ref 22–32)
Calcium: 8.9 mg/dL (ref 8.9–10.3)
Chloride: 103 mmol/L (ref 98–111)
Creatinine, Ser: 0.92 mg/dL (ref 0.61–1.24)
GFR, Estimated: >60 mL/min (ref >60)
Glucose, Bld: 90 mg/dL (ref 70–99)
Potassium: 3.8 mmol/L (ref 3.5–5.1)
Sodium: 137 mmol/L (ref 135–145)
Total Bilirubin: 1.7 mg/dL — ABNORMAL HIGH (ref 0.0–1.2)
Total Protein: 6.6 g/dL (ref 6.5–8.1)

## 2024-06-11 LAB — CBC
HCT: 42.4 % (ref 39.0–52.0)
Hemoglobin: 14.3 g/dL (ref 13.0–17.0)
MCH: 29.5 pg (ref 26.0–34.0)
MCHC: 33.7 g/dL (ref 30.0–36.0)
MCV: 87.4 fL (ref 80.0–100.0)
Platelets: 139 K/uL — ABNORMAL LOW (ref 150–400)
RBC: 4.85 MIL/uL (ref 4.22–5.81)
RDW: 13.2 % (ref 11.5–15.5)
WBC: 8.7 K/uL (ref 4.0–10.5)
nRBC: 0 % (ref 0.0–0.2)

## 2024-06-11 LAB — CK: Total CK: 1029 U/L — ABNORMAL HIGH (ref 49–397)

## 2024-06-11 LAB — MAGNESIUM: Magnesium: 2.1 mg/dL (ref 1.7–2.4)

## 2024-06-11 MED ORDER — LEVETIRACETAM 750 MG PO TABS: 750.0000 mg | ORAL_TABLET | Freq: Two times a day (BID) | ORAL | 0 refills | Status: AC

## 2024-06-11 MED ORDER — SENNOSIDES-DOCUSATE SODIUM 8.6-50 MG PO TABS: 1.0000 | ORAL_TABLET | Freq: Two times a day (BID) | ORAL | Status: AC | PRN

## 2024-06-11 NOTE — Progress Notes (Addendum)
 Subjective: No acute events overnight.  States he is at baseline.  Denies any concerns.   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   Motor: 5/5 strength in all 4 extremities Coordination: normal Gait: not tested  Basic Metabolic Panel: Recent Labs  Lab 06/10/24 0325 06/10/24 0630  NA 137  --   K 3.3*  --   CL 103  --   CO2 18*  --   GLUCOSE 164*  --   BUN 8  --   CREATININE 1.21  --   CALCIUM 9.0  --   MG  --  2.5*    CBC: Recent Labs  Lab 06/10/24 0325  WBC 19.3*  NEUTROABS 17.7*  HGB 13.4  HCT 41.1  MCV 89.9  PLT 175     Coagulation Studies: No results for input(s): LABPROT, INR in the last 72 hours.  Imaging personally reviewed  CTH wo contrast 06/10/2024: No acute findings. Stable right frontal craniotomy, frontal encephalomalacia, and right frontal ventriculostomy catheter with decompressed ventricles. 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   ASSESSMENT AND PLAN: 48 year old male with breakthrough seizures, no clear etiology.  Epilepsy with breakthrough seizure - Increase Keppra  from 500 mg twice daily to 750 milligrams twice daily - Continue seizure precautions including no driving - Continue to follow-up with neurology in 2 to 3 months - Discussed plan with Dr.Gonfa via secure chat  Seizure precautions: Per [place]  DMV statutes, patients with seizures are not allowed to drive until they have been seizure-free for six months and  cleared by a physician    Use caution when using heavy equipment or power tools. Avoid working on ladders or at heights. Take showers instead of baths. Ensure the water  temperature is not too high on the home water  heater. Do not go swimming alone. Do not lock yourself in a room alone (i.e. bathroom). When caring for infants or small children, sit down when holding, feeding, or changing them to minimize risk of injury to the child in the event you have a seizure. Maintain good sleep hygiene. Avoid alcohol.    If patient has another seizure, call 911 and bring them back to the ED if: A.  The seizure lasts longer than 5 minutes.      B.  The patient doesn't wake shortly after the seizure or has new problems such as difficulty seeing, speaking or moving following the seizure C.  The patient was injured during the seizure D.  The patient has a temperature over 102 F (39C) E.  The patient vomited during the seizure and now is having trouble breathing    During the Seizure   - First, ensure adequate ventilation and place patients on the floor on their left side  Loosen clothing around the neck and ensure the airway is patent. If the patient is clenching the teeth, do not force the mouth open with any object as this can cause severe damage - Remove all items from the  surrounding that can be hazardous. The patient may be oblivious to what's happening and may not even know what he or she is doing. If the patient is confused and wandering, either gently guide him/her away and block access to outside areas - Reassure the individual and be comforting - Call 911. In most cases, the seizure ends before EMS arrives. However, there are cases when seizures may last over 3 to 5 minutes. Or the individual may have developed breathing difficulties or severe injuries. If a pregnant patient or a person with diabetes develops a seizure, it is prudent to call an ambulance.     After the Seizure (Postictal Stage)    After a seizure, most patients experience confusion, fatigue, muscle pain and/or a headache. Thus, one should permit the individual to sleep. For the next few days, reassurance is essential. Being calm and helping reorient the person is also of importance.   Most seizures are painless and end spontaneously. Seizures are not harmful to others but can lead to complications such as stress on the lungs, brain and the heart. Individuals with prior lung problems may develop labored breathing and respiratory distress.           I personally spent a total of 35 minutes in the care of the patient today including getting/reviewing separately obtained history, performing a medically appropriate exam/evaluation, counseling and educating, placing orders, referring and communicating with other health care professionals, documenting clinical information in the EHR, independently interpreting results, and coordinating care.        Arlin Krebs Epilepsy [facility] For questions after 5pm please refer to AMION to reach the Neurologist on call

## 2024-06-11 NOTE — TOC Transition Note (Signed)
 Transition of Care [facility]) - Discharge Note   Patient Details  Name: Mark Chambers MRN: [id number] Date of Birth: 03/05/1976  [facility] CM/SW Contact:  Andrez JULIANNA George, RN Phone Number: 06/11/2024, 10:46 AM   Clinical Narrative:     Pt is discharging home with self care and family.  Mom is aware of dc home and will provide needed transportation.   Final next level of care: Home/Self Care Barriers to Discharge: No Barriers Identified   Patient Goals and CMS Choice            Discharge Placement                       Discharge Plan and Services Additional resources added to the After Visit Summary for                                       Social Drivers of Health (SDOH) Interventions SDOH Screenings   Food Insecurity: No Food Insecurity (01/13/2022)   Received from [facility]  Housing: Unknown (04/07/2024)   Received from [facility]  Transportation Needs: No Transportation Needs (10/15/2021)   [facility]  Depression [phone]): Low Risk  (12/02/2021)  Financial Resource Strain: Low Risk  (10/15/2021)   Received from [facility]  Tobacco Use: Medium Risk (06/10/2024)     Readmission Risk Interventions     No data to display

## 2024-06-11 NOTE — Progress Notes (Signed)
 D/c tele and iv. Went over AVS with pt and his mother and all questions were addressed.   Amado GORMAN Arabia, RN
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
## 2024-06-11 NOTE — Care Management Obs Status (Signed)
 MEDICARE OBSERVATION STATUS NOTIFICATION   Patient Details  Name: Mark Chambers MRN: [id number] Date of Birth: August 07, 1976   Medicare Observation Status Notification Given:  Yes    Andrez JULIANNA George, RN 06/11/2024, 10:44 AM
